# Patient Record
Sex: Male | Born: 1940
Health system: Southern US, Community
[De-identification: ages and names within clinical notes are randomized; demographics above are authoritative.]

## PROBLEM LIST (undated history)

## (undated) DIAGNOSIS — I639 Cerebral infarction, unspecified: Secondary | ICD-10-CM

## (undated) DIAGNOSIS — Z720 Tobacco use: Secondary | ICD-10-CM

## (undated) DIAGNOSIS — E785 Hyperlipidemia, unspecified: Secondary | ICD-10-CM

## (undated) DIAGNOSIS — I2510 Atherosclerotic heart disease of native coronary artery without angina pectoris: Secondary | ICD-10-CM

## (undated) DIAGNOSIS — I10 Essential (primary) hypertension: Secondary | ICD-10-CM

## (undated) HISTORY — DX: Hyperlipidemia, unspecified: E78.5

## (undated) HISTORY — DX: Atherosclerotic heart disease of native coronary artery without angina pectoris: I25.10

## (undated) HISTORY — DX: Tobacco use: Z72.0

## (undated) HISTORY — DX: Essential (primary) hypertension: I10

## (undated) HISTORY — DX: Cerebral infarction, unspecified: I63.9

---

## 2004-12-12 ENCOUNTER — Ambulatory Visit: Payer: Self-pay | Admitting: Cardiology

## 2004-12-12 ENCOUNTER — Ambulatory Visit (HOSPITAL_COMMUNITY): Admission: RE | Admit: 2004-12-12 | Discharge: 2004-12-12 | Payer: Self-pay | Admitting: Cardiology

## 2015-02-12 ENCOUNTER — Telehealth: Payer: Self-pay | Admitting: Cardiovascular Disease

## 2015-02-12 NOTE — Telephone Encounter (Signed)
Received records from [facility] for appointment on 02/20/15 with Dr Gwenlyn Found.  Records given to D Ricci Barker (medical records) for Dr Kennon Holter schedule on 02/20/15.  lp

## 2015-02-14 DIAGNOSIS — F0781 Postconcussional syndrome: Secondary | ICD-10-CM | POA: Insufficient documentation

## 2015-02-20 ENCOUNTER — Ambulatory Visit (INDEPENDENT_AMBULATORY_CARE_PROVIDER_SITE_OTHER): Payer: Medicare HMO | Admitting: Cardiovascular Disease

## 2015-02-20 ENCOUNTER — Encounter: Payer: Self-pay | Admitting: Cardiovascular Disease

## 2015-02-20 VITALS — BP 126/68 | HR 65 | Ht 64.0 in | Wt 169.8 lb

## 2015-02-20 DIAGNOSIS — Z720 Tobacco use: Secondary | ICD-10-CM | POA: Diagnosis not present

## 2015-02-20 DIAGNOSIS — I10 Essential (primary) hypertension: Secondary | ICD-10-CM | POA: Diagnosis not present

## 2015-02-20 DIAGNOSIS — R079 Chest pain, unspecified: Secondary | ICD-10-CM

## 2015-02-20 DIAGNOSIS — I2510 Atherosclerotic heart disease of native coronary artery without angina pectoris: Secondary | ICD-10-CM

## 2015-02-20 DIAGNOSIS — I2583 Coronary atherosclerosis due to lipid rich plaque: Secondary | ICD-10-CM

## 2015-02-20 DIAGNOSIS — E785 Hyperlipidemia, unspecified: Secondary | ICD-10-CM

## 2015-02-20 NOTE — Assessment & Plan Note (Signed)
History of CAD status post Xience drug-eluting stent implantation in the mid RCA at [facility] 12/09/12. This was done because of chest pain. He continues to have chest pain at night which occasionally awakens her from sleep and was nitrate responsive. I am going to get a exercise Myoview stress test to rule out an ischemic etiology.

## 2015-02-20 NOTE — Patient Instructions (Signed)
  We will see you back in follow up in 1 year with Dr Gwenlyn Found.   Dr Gwenlyn Found has ordered: Exercise Myoview- this is a test that looks at the blood flow to your heart muscle.  It takes approximately 2 1/2 hours. Please follow instruction sheet, as given.

## 2015-02-20 NOTE — Assessment & Plan Note (Signed)
History of hypertension blood pressure measured at 126/68. He is on valsartan. Continued current meds at current dosing

## 2015-02-20 NOTE — Assessment & Plan Note (Signed)
History of hyperlipidemia on atorvastatin followed by his PCP 

## 2015-02-20 NOTE — Progress Notes (Signed)
02/20/2015 Justin Conley   1940/09/19  [id number]  Primary Physician No primary care provider on file. Primary Cardiologist: Lorretta Harp MD Renae Gloss   HPI:  Mr. Owczarzak is a 74 year old mildly overweight married Caucasian male father of 4 children, grandfather of a gradual gentleman who is accompanied by his wife Justin Conley today. 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 He was referred by Dr. Ilda Basset in [place] to be established in our practice. His risk factors include treated hypertension, hyperlipidemia and 50-pack-years of tobacco abuse continuing to smoke one pack per day. He does have a family history of heart disease in the father who died of a myocardial infarction and 3 siblings who had CAD as well. He had a stroke 10 years ago that has not left him with any deficits. He had a drug-eluting stent placed in his mid RCA 12/09/12 at [facility]. Does occasionally get nocturnal chest pain which is nitrate responsive.   Current Outpatient Prescriptions  Medication Sig Dispense Refill  . aspirin EC 81 MG tablet Take 81 mg by mouth.    Marland Kitchen atorvastatin (LIPITOR) 40 MG tablet Take 40 mg by mouth daily.    . clopidogrel (PLAVIX) 75 MG tablet Take 75 mg by mouth.    . valsartan (DIOVAN) 320 MG tablet Take 320 mg by mouth.     No current facility-administered medications for this visit.    Not on File  History   Social History  . Marital Status: Married    Spouse Name: N/A  . Number of Children: N/A  . Years of Education: N/A   Occupational History  . Not on file.   Social History Main Topics  . Smoking status: Current Every Day Smoker  . Smokeless tobacco: Not on file  . Alcohol Use: Not on file  . Drug Use: Not on file  . Sexual Activity: Not on file   Other Topics Concern  . Not on file   Social History Narrative  . No narrative on file     Review of Systems: General:  negative for chills, fever, night sweats or weight changes.  Cardiovascular: negative for chest pain, dyspnea on exertion, edema, orthopnea, palpitations, paroxysmal nocturnal dyspnea or shortness of breath Dermatological: negative for rash Respiratory: negative for cough or wheezing Urologic: negative for hematuria Abdominal: negative for nausea, vomiting, diarrhea, bright red blood per rectum, melena, or hematemesis Neurologic: negative for visual changes, syncope, or dizziness All other systems reviewed and are otherwise negative except as noted above.    Blood pressure 126/68, pulse 65, height 5\' 4"  (1.626 m), weight 169 lb 12.8 oz (77.021 kg).  General appearance: alert and no distress Neck: no adenopathy, no carotid bruit, no JVD, supple, symmetrical, trachea midline and thyroid not enlarged, symmetric, no tenderness/mass/nodules Lungs: clear to auscultation bilaterally Heart: regular rate and rhythm, S1, S2 normal, no murmur, click, rub or gallop Extremities: extremities normal, atraumatic, no cyanosis or edema  EKG normal sinus rhythm at 65 without ST or T-wave changes. I personally reviewed this EKG  ASSESSMENT AND PLAN:   Essential hypertension History of hypertension blood pressure measured at 126/68. He is on valsartan. Continued current meds at current dosing  Hyperlipidemia History of hyperlipidemia on atorvastatin followed by his PCP  Tobacco abuse History of tobacco abuse currently smoking one pack a day with 50-pack-year history recalcitrant to risk factor modification  Coronary artery disease History of  CAD status post Xience drug-eluting stent implantation in the mid RCA at [facility] 12/09/12. This was done because of chest pain. He continues to have chest pain at night which occasionally awakens her from sleep and was nitrate responsive. I am going to get a exercise Myoview stress test to rule out an ischemic etiology.      Lorretta Harp  MD FACP,FACC,FAHA, [facility] 02/20/2015 8:49 AM

## 2015-02-20 NOTE — Assessment & Plan Note (Signed)
History of tobacco abuse currently smoking one pack a day with 50-pack-year history recalcitrant to risk factor modification

## 2015-02-21 ENCOUNTER — Encounter: Payer: Self-pay | Admitting: Cardiovascular Disease

## 2015-03-01 ENCOUNTER — Telehealth (HOSPITAL_COMMUNITY): Payer: Self-pay

## 2015-03-01 NOTE — Telephone Encounter (Signed)
Encounter complete. 

## 2015-03-05 ENCOUNTER — Encounter: Payer: Self-pay | Admitting: Cardiovascular Disease

## 2015-03-06 ENCOUNTER — Encounter (HOSPITAL_COMMUNITY): Payer: Medicare HMO

## 2015-03-06 ENCOUNTER — Ambulatory Visit (HOSPITAL_COMMUNITY)
Admission: RE | Admit: 2015-03-06 | Discharge: 2015-03-06 | Disposition: A | Discharge status: Home / Self Care | Payer: Medicare HMO | Source: Ambulatory Visit | Attending: Cardiovascular Disease | Admitting: Cardiovascular Disease

## 2015-03-06 DIAGNOSIS — R079 Chest pain, unspecified: Secondary | ICD-10-CM | POA: Insufficient documentation

## 2015-03-06 LAB — MYOCARDIAL PERFUSION IMAGING
CHL CUP MPHR: 149 {beats}/min
CSEPED: 7 min
CSEPEDS: 32 s
Estimated workload: 7.9 METS
LV dias vol: 73 mL
LV sys vol: 29 mL
Peak HR: 134 {beats}/min
Percent HR: 89 %
RPE: 14
Rest HR: 64 {beats}/min
SDS: 0
SRS: 1
SSS: 1
TID: 1.19

## 2015-03-06 MED ORDER — TECHNETIUM TC 99M SESTAMIBI GENERIC - CARDIOLITE
10.2000 | Freq: Once | INTRAVENOUS | Status: AC | PRN
  Administered 2015-03-06: 10 via INTRAVENOUS

## 2015-03-06 MED ORDER — TECHNETIUM TC 99M SESTAMIBI GENERIC - CARDIOLITE
31.3000 | Freq: Once | INTRAVENOUS | Status: AC | PRN
  Administered 2015-03-06: 31.3 via INTRAVENOUS

## 2015-05-10 DIAGNOSIS — Z23 Encounter for immunization: Secondary | ICD-10-CM | POA: Diagnosis not present

## 2015-05-22 DIAGNOSIS — Z9181 History of falling: Secondary | ICD-10-CM | POA: Diagnosis not present

## 2015-05-22 DIAGNOSIS — I2510 Atherosclerotic heart disease of native coronary artery without angina pectoris: Secondary | ICD-10-CM | POA: Diagnosis not present

## 2015-05-22 DIAGNOSIS — D519 Vitamin B12 deficiency anemia, unspecified: Secondary | ICD-10-CM | POA: Diagnosis not present

## 2015-05-22 DIAGNOSIS — R69 Illness, unspecified: Secondary | ICD-10-CM | POA: Diagnosis not present

## 2015-05-22 DIAGNOSIS — I10 Essential (primary) hypertension: Secondary | ICD-10-CM | POA: Diagnosis not present

## 2015-05-22 DIAGNOSIS — E785 Hyperlipidemia, unspecified: Secondary | ICD-10-CM | POA: Diagnosis not present

## 2015-05-22 DIAGNOSIS — Z6828 Body mass index (BMI) 28.0-28.9, adult: Secondary | ICD-10-CM | POA: Diagnosis not present

## 2015-05-23 DIAGNOSIS — E785 Hyperlipidemia, unspecified: Secondary | ICD-10-CM | POA: Diagnosis not present

## 2015-05-23 DIAGNOSIS — D519 Vitamin B12 deficiency anemia, unspecified: Secondary | ICD-10-CM | POA: Diagnosis not present

## 2015-06-06 ENCOUNTER — Encounter: Payer: Self-pay | Admitting: Cardiovascular Disease

## 2015-07-24 DIAGNOSIS — L570 Actinic keratosis: Secondary | ICD-10-CM | POA: Diagnosis not present

## 2015-07-24 DIAGNOSIS — B353 Tinea pedis: Secondary | ICD-10-CM | POA: Diagnosis not present

## 2015-09-10 DIAGNOSIS — E7800 Pure hypercholesterolemia, unspecified: Secondary | ICD-10-CM | POA: Diagnosis not present

## 2015-09-10 DIAGNOSIS — I252 Old myocardial infarction: Secondary | ICD-10-CM | POA: Diagnosis not present

## 2015-09-10 DIAGNOSIS — I10 Essential (primary) hypertension: Secondary | ICD-10-CM | POA: Diagnosis not present

## 2015-11-05 DIAGNOSIS — J449 Chronic obstructive pulmonary disease, unspecified: Secondary | ICD-10-CM | POA: Diagnosis not present

## 2015-11-05 DIAGNOSIS — Z6828 Body mass index (BMI) 28.0-28.9, adult: Secondary | ICD-10-CM | POA: Diagnosis not present

## 2015-11-05 DIAGNOSIS — D1720 Benign lipomatous neoplasm of skin and subcutaneous tissue of unspecified limb: Secondary | ICD-10-CM | POA: Diagnosis not present

## 2015-11-06 DIAGNOSIS — R69 Illness, unspecified: Secondary | ICD-10-CM | POA: Diagnosis not present

## 2015-11-06 DIAGNOSIS — Z950 Presence of cardiac pacemaker: Secondary | ICD-10-CM | POA: Diagnosis not present

## 2015-11-06 DIAGNOSIS — K649 Unspecified hemorrhoids: Secondary | ICD-10-CM | POA: Diagnosis not present

## 2015-11-07 DIAGNOSIS — R69 Illness, unspecified: Secondary | ICD-10-CM | POA: Diagnosis not present

## 2015-11-07 DIAGNOSIS — Z6827 Body mass index (BMI) 27.0-27.9, adult: Secondary | ICD-10-CM | POA: Diagnosis not present

## 2015-11-07 DIAGNOSIS — J441 Chronic obstructive pulmonary disease with (acute) exacerbation: Secondary | ICD-10-CM | POA: Diagnosis not present

## 2016-01-01 DIAGNOSIS — R413 Other amnesia: Secondary | ICD-10-CM | POA: Diagnosis not present

## 2016-01-01 DIAGNOSIS — I10 Essential (primary) hypertension: Secondary | ICD-10-CM | POA: Diagnosis not present

## 2016-01-01 DIAGNOSIS — D519 Vitamin B12 deficiency anemia, unspecified: Secondary | ICD-10-CM | POA: Diagnosis not present

## 2016-01-01 DIAGNOSIS — Z8673 Personal history of transient ischemic attack (TIA), and cerebral infarction without residual deficits: Secondary | ICD-10-CM | POA: Diagnosis not present

## 2016-01-01 DIAGNOSIS — Z125 Encounter for screening for malignant neoplasm of prostate: Secondary | ICD-10-CM | POA: Diagnosis not present

## 2016-01-01 DIAGNOSIS — R69 Illness, unspecified: Secondary | ICD-10-CM | POA: Diagnosis not present

## 2016-01-01 DIAGNOSIS — I2510 Atherosclerotic heart disease of native coronary artery without angina pectoris: Secondary | ICD-10-CM | POA: Diagnosis not present

## 2016-01-01 DIAGNOSIS — Z6828 Body mass index (BMI) 28.0-28.9, adult: Secondary | ICD-10-CM | POA: Diagnosis not present

## 2016-01-01 DIAGNOSIS — E785 Hyperlipidemia, unspecified: Secondary | ICD-10-CM | POA: Diagnosis not present

## 2016-01-07 DIAGNOSIS — H40003 Preglaucoma, unspecified, bilateral: Secondary | ICD-10-CM | POA: Diagnosis not present

## 2016-01-28 DIAGNOSIS — I6523 Occlusion and stenosis of bilateral carotid arteries: Secondary | ICD-10-CM | POA: Diagnosis not present

## 2016-01-28 DIAGNOSIS — Z8673 Personal history of transient ischemic attack (TIA), and cerebral infarction without residual deficits: Secondary | ICD-10-CM | POA: Diagnosis not present

## 2016-01-28 DIAGNOSIS — I6521 Occlusion and stenosis of right carotid artery: Secondary | ICD-10-CM | POA: Diagnosis not present

## 2016-03-14 DIAGNOSIS — H1033 Unspecified acute conjunctivitis, bilateral: Secondary | ICD-10-CM | POA: Diagnosis not present

## 2016-04-22 DIAGNOSIS — Z1389 Encounter for screening for other disorder: Secondary | ICD-10-CM | POA: Diagnosis not present

## 2016-04-22 DIAGNOSIS — E785 Hyperlipidemia, unspecified: Secondary | ICD-10-CM | POA: Diagnosis not present

## 2016-04-22 DIAGNOSIS — Z6828 Body mass index (BMI) 28.0-28.9, adult: Secondary | ICD-10-CM | POA: Diagnosis not present

## 2016-04-22 DIAGNOSIS — I2510 Atherosclerotic heart disease of native coronary artery without angina pectoris: Secondary | ICD-10-CM | POA: Diagnosis not present

## 2016-04-22 DIAGNOSIS — I10 Essential (primary) hypertension: Secondary | ICD-10-CM | POA: Diagnosis not present

## 2016-04-22 DIAGNOSIS — G309 Alzheimer's disease, unspecified: Secondary | ICD-10-CM | POA: Diagnosis not present

## 2016-04-22 DIAGNOSIS — D519 Vitamin B12 deficiency anemia, unspecified: Secondary | ICD-10-CM | POA: Diagnosis not present

## 2016-04-22 DIAGNOSIS — R69 Illness, unspecified: Secondary | ICD-10-CM | POA: Diagnosis not present

## 2016-07-06 DIAGNOSIS — L0390 Cellulitis, unspecified: Secondary | ICD-10-CM | POA: Diagnosis not present

## 2016-07-06 DIAGNOSIS — M86242 Subacute osteomyelitis, left hand: Secondary | ICD-10-CM | POA: Diagnosis not present

## 2016-07-06 IMAGING — NM NM MISC PROCEDURE
6 series · 36 of 36 positions shown · non-contrast
Comparison: none

[Series 1: wbr_r-proj_st wbr rest · 6.40mm/px · 6 of 64 frames shown]
[frame 6/64]
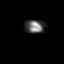
[frame 16/64]
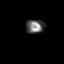
[frame 27/64]
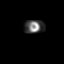
[frame 38/64]
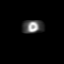
[frame 48/64]
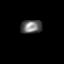
[frame 59/64]
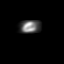

[Series 1: wbr rest · 6.40mm/px · 6 of 64 frames shown]
[frame 6/64]
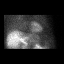
[frame 16/64]
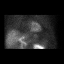
[frame 27/64]
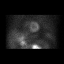
[frame 38/64]
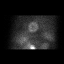
[frame 48/64]
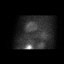
[frame 59/64]
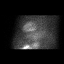

[Series 2: wbr stress-gsp · 6.40mm/px · 6 of 512 frames shown]
[frame 43/512  full-range]
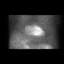
[frame 128/512  full-range]
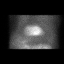
[frame 214/512  full-range]
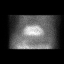
[frame 299/512  full-range]
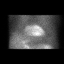
[frame 384/512  full-range]
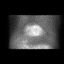
[frame 470/512  full-range]
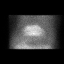

[Series 2: wbr_s-proj_st wbr stress-gsp · 6.40mm/px · 6 of 512 frames shown]
[frame 43/512]
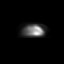
[frame 128/512]
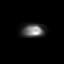
[frame 214/512]
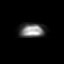
[frame 299/512]
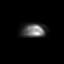
[frame 384/512]
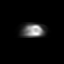
[frame 470/512]
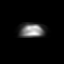

[Series 3: wbr stress-sum-em · 6.40mm/px · 6 of 64 frames shown]
[frame 6/64]
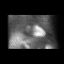
[frame 16/64]
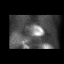
[frame 27/64]
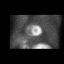
[frame 38/64]
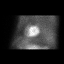
[frame 48/64]
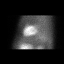
[frame 59/64]
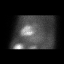

[Series 3: wbr_s-proj_st wbr stress-sum-em · 6.40mm/px · 6 of 64 frames shown]
[frame 6/64]
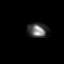
[frame 16/64]
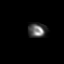
[frame 27/64]
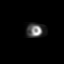
[frame 38/64]
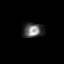
[frame 48/64]
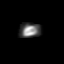
[frame 59/64]
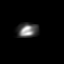

[36 of 36 positions shown; findings below may reference images not displayed]

Canned report from images found in remote index.

Refer to host system for actual result text.

## 2016-08-05 DIAGNOSIS — R69 Illness, unspecified: Secondary | ICD-10-CM | POA: Diagnosis not present

## 2016-08-28 DIAGNOSIS — R69 Illness, unspecified: Secondary | ICD-10-CM | POA: Diagnosis not present

## 2016-09-05 DIAGNOSIS — T8484XA Pain due to internal orthopedic prosthetic devices, implants and grafts, initial encounter: Secondary | ICD-10-CM | POA: Diagnosis not present

## 2016-09-10 DIAGNOSIS — M25561 Pain in right knee: Secondary | ICD-10-CM | POA: Diagnosis not present

## 2016-09-10 DIAGNOSIS — T8484XA Pain due to internal orthopedic prosthetic devices, implants and grafts, initial encounter: Secondary | ICD-10-CM | POA: Diagnosis not present

## 2016-09-10 DIAGNOSIS — X58XXXA Exposure to other specified factors, initial encounter: Secondary | ICD-10-CM | POA: Diagnosis not present

## 2016-09-12 DIAGNOSIS — I2510 Atherosclerotic heart disease of native coronary artery without angina pectoris: Secondary | ICD-10-CM | POA: Diagnosis not present

## 2016-09-12 DIAGNOSIS — I10 Essential (primary) hypertension: Secondary | ICD-10-CM | POA: Diagnosis not present

## 2016-09-12 DIAGNOSIS — D519 Vitamin B12 deficiency anemia, unspecified: Secondary | ICD-10-CM | POA: Diagnosis not present

## 2016-09-12 DIAGNOSIS — R69 Illness, unspecified: Secondary | ICD-10-CM | POA: Diagnosis not present

## 2016-09-12 DIAGNOSIS — K439 Ventral hernia without obstruction or gangrene: Secondary | ICD-10-CM | POA: Diagnosis not present

## 2016-09-12 DIAGNOSIS — E785 Hyperlipidemia, unspecified: Secondary | ICD-10-CM | POA: Diagnosis not present

## 2016-09-18 DIAGNOSIS — T8484XA Pain due to internal orthopedic prosthetic devices, implants and grafts, initial encounter: Secondary | ICD-10-CM | POA: Diagnosis not present

## 2016-09-18 DIAGNOSIS — T84038A Mechanical loosening of other internal prosthetic joint, initial encounter: Secondary | ICD-10-CM | POA: Diagnosis not present

## 2016-12-03 DIAGNOSIS — Z125 Encounter for screening for malignant neoplasm of prostate: Secondary | ICD-10-CM | POA: Diagnosis not present

## 2016-12-03 DIAGNOSIS — Z1389 Encounter for screening for other disorder: Secondary | ICD-10-CM | POA: Diagnosis not present

## 2016-12-03 DIAGNOSIS — E785 Hyperlipidemia, unspecified: Secondary | ICD-10-CM | POA: Diagnosis not present

## 2016-12-03 DIAGNOSIS — Z9181 History of falling: Secondary | ICD-10-CM | POA: Diagnosis not present

## 2016-12-03 DIAGNOSIS — Z0000 Encounter for general adult medical examination without abnormal findings: Secondary | ICD-10-CM | POA: Diagnosis not present

## 2016-12-03 DIAGNOSIS — R7301 Impaired fasting glucose: Secondary | ICD-10-CM | POA: Diagnosis not present

## 2016-12-09 DIAGNOSIS — R1013 Epigastric pain: Secondary | ICD-10-CM | POA: Diagnosis not present

## 2016-12-09 DIAGNOSIS — K432 Incisional hernia without obstruction or gangrene: Secondary | ICD-10-CM | POA: Insufficient documentation

## 2016-12-10 DIAGNOSIS — R7301 Impaired fasting glucose: Secondary | ICD-10-CM | POA: Diagnosis not present

## 2016-12-16 ENCOUNTER — Encounter: Payer: Self-pay | Admitting: Cardiovascular Disease

## 2016-12-16 ENCOUNTER — Ambulatory Visit (INDEPENDENT_AMBULATORY_CARE_PROVIDER_SITE_OTHER): Payer: Medicare HMO | Admitting: Cardiovascular Disease

## 2016-12-16 VITALS — BP 122/54 | HR 64 | Ht 66.5 in | Wt 181.6 lb

## 2016-12-16 DIAGNOSIS — Z125 Encounter for screening for malignant neoplasm of prostate: Secondary | ICD-10-CM | POA: Diagnosis not present

## 2016-12-16 DIAGNOSIS — I10 Essential (primary) hypertension: Secondary | ICD-10-CM | POA: Diagnosis not present

## 2016-12-16 DIAGNOSIS — Z720 Tobacco use: Secondary | ICD-10-CM | POA: Diagnosis not present

## 2016-12-16 DIAGNOSIS — D519 Vitamin B12 deficiency anemia, unspecified: Secondary | ICD-10-CM | POA: Diagnosis not present

## 2016-12-16 DIAGNOSIS — I6523 Occlusion and stenosis of bilateral carotid arteries: Secondary | ICD-10-CM | POA: Diagnosis not present

## 2016-12-16 DIAGNOSIS — F17200 Nicotine dependence, unspecified, uncomplicated: Secondary | ICD-10-CM | POA: Diagnosis not present

## 2016-12-16 DIAGNOSIS — E7800 Pure hypercholesterolemia, unspecified: Secondary | ICD-10-CM

## 2016-12-16 DIAGNOSIS — E785 Hyperlipidemia, unspecified: Secondary | ICD-10-CM | POA: Diagnosis not present

## 2016-12-16 DIAGNOSIS — I2510 Atherosclerotic heart disease of native coronary artery without angina pectoris: Secondary | ICD-10-CM | POA: Diagnosis not present

## 2016-12-16 DIAGNOSIS — H6121 Impacted cerumen, right ear: Secondary | ICD-10-CM | POA: Diagnosis not present

## 2016-12-16 DIAGNOSIS — Z6828 Body mass index (BMI) 28.0-28.9, adult: Secondary | ICD-10-CM | POA: Diagnosis not present

## 2016-12-16 DIAGNOSIS — R69 Illness, unspecified: Secondary | ICD-10-CM | POA: Diagnosis not present

## 2016-12-16 NOTE — Assessment & Plan Note (Signed)
History of continued tobacco abuse of one pack per day recalcitrant to risk factor modification.. 

## 2016-12-16 NOTE — Progress Notes (Signed)
12/16/2016 Justin Conley   21-Aug-1940  [id number]  Primary Physician Justin Dress, MD Primary Cardiologist: Justin Harp MD Justin Conley  HPI:  Justin Conley is a 76 year old mildly overweight married Caucasian male father of 4 children, grandfather of 8 grandchildren who is accompanied by his wife Justin Conley today. 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 Ilda Conley in [place] to be established in our practice. I last saw him in the office 02/20/15 His risk factors include treated hypertension, hyperlipidemia and 50-pack-years of tobacco abuse continuing to smoke one pack per day. He does have a family history of heart disease in the father who died of a myocardial infarction and 3 siblings who had CAD as well. He had a stroke 10 years ago that has not left him with any deficits. He had a drug-eluting stent placed in his mid RCA 12/09/12 at [facility]. Does occasionally get nocturnal chest pain which is nitrate responsive. He had a Myoview stress test performed in our office 03/06/15 which was low risk. I saw him 2 years ago he denies chest pain or shortness of breath. He apparently needs a small ventral hernia repaired in the near future which he will be cleared for.  Current Outpatient Prescriptions  Medication Sig Dispense Refill  . aspirin EC 81 MG tablet Take 81 mg by mouth.    Marland Kitchen atorvastatin (LIPITOR) 40 MG tablet Take 40 mg by mouth daily.    . citalopram (CELEXA) 20 MG tablet Take 20 mg by mouth daily.  2  . clopidogrel (PLAVIX) 75 MG tablet Take 1 tablet by mouth daily.    . cyanocobalamin (,VITAMIN B-12,) 1000 MCG/ML injection Inject 1 mL into the muscle every 30 (thirty) days.    Marland Kitchen donepezil (ARICEPT) 5 MG tablet Take 1 tablet by mouth daily.    Marland Kitchen PROAIR HFA 108 (90 Base) MCG/ACT inhaler Inhale 2 puffs into the lungs as directed.  7  . valsartan (DIOVAN) 320 MG tablet  Take 320 mg by mouth.    . valsartan (DIOVAN) 320 MG tablet Take 1 tablet by mouth daily.    . valsartan-hydrochlorothiazide (DIOVAN-HCT) 320-25 MG tablet Take 1 tablet by mouth daily.     No current facility-administered medications for this visit.     Allergies  Allergen Reactions  . Ace Inhibitors Cough    Social History   Social History  . Marital status: Married    Spouse name: N/A  . Number of children: N/A  . Years of education: N/A   Occupational History  . Not on file.   Social History Main Topics  . Smoking status: Current Every Day Smoker  . Smokeless tobacco: Current User    Types: Chew  . Alcohol use Not on file  . Drug use: Unknown  . Sexual activity: Not on file   Other Topics Concern  . Not on file   Social History Narrative  . No narrative on file     Review of Systems: General: negative for chills, fever, night sweats or weight changes.  Cardiovascular: negative for chest pain, dyspnea on exertion, edema, orthopnea, palpitations, paroxysmal nocturnal dyspnea or shortness of breath Dermatological: negative for rash Respiratory: negative for cough or wheezing Urologic: negative for hematuria Abdominal: negative for nausea, vomiting, diarrhea, bright red blood per rectum, melena, or hematemesis Neurologic: negative for visual changes, syncope, or dizziness All other  systems reviewed and are otherwise negative except as noted above.    Blood pressure (!) 122/54, pulse 64, height 5' 6.5" (1.689 m), weight 181 lb 9.6 oz (82.4 kg).  General appearance: alert and no distress Neck: no adenopathy, no carotid bruit, no JVD, supple, symmetrical, trachea midline and thyroid not enlarged, symmetric, no tenderness/mass/nodules Lungs: clear to auscultation bilaterally Heart: regular rate and rhythm, S1, S2 normal, no murmur, click, rub or gallop Extremities: extremities normal, atraumatic, no cyanosis or edema  EKG sinus rhythm at 64 without ST or T-wave  changes. I personally reviewed this EKG  ASSESSMENT AND PLAN:   Essential hypertension History of hypertension blood pressure measured at 126/68. He is on valsartan. Continue current meds at current dosing  Hyperlipidemia History of hyperlipidemia on statin therapy followed by his PCP  Tobacco abuse History of continued tobacco abuse of one pack per day recalcitrant to risk factor modification  Coronary artery disease History of coronary artery disease status post mid RCA PCI and drug-eluting stenting 12/09/12 at [facility]. He did have a Myoview stress test performed because of occasional chest pain 03/06/15 which was entirely normal. Since I saw him 2 years ago and he has remained stable denying chest pain. He is no longer on dual antiplatelet therapy .      Justin Harp MD FACP,FACC,FAHA, [facility] 12/16/2016 2:17 PM

## 2016-12-16 NOTE — Assessment & Plan Note (Signed)
History of hypertension blood pressure measured at 126/68. He is on valsartan. Continue current meds at current dosing

## 2016-12-16 NOTE — Patient Instructions (Signed)
Medication Instructions: Your physician recommends that you continue on your current medications as directed. Please refer to the Current Medication list given to you today.   Follow-Up: Your physician wants you to follow-up in: 1 year with Dr. Gwenlyn Found. You will receive a reminder letter in the mail two months in advance. If you don't receive a letter, please call our office to schedule the follow-up appointment.  If you need a refill on your cardiac medications before your next appointment, please call your pharmacy.  You have been cleared at low risk for your procedure.

## 2016-12-16 NOTE — Assessment & Plan Note (Signed)
History of coronary artery disease status post mid RCA PCI and drug-eluting stenting 12/09/12 at [facility]. He did have a Myoview stress test performed because of occasional chest pain 03/06/15 which was entirely normal. Since I saw him 2 years ago and he has remained stable denying chest pain. He is no longer on dual antiplatelet therapy .

## 2016-12-16 NOTE — Assessment & Plan Note (Signed)
History of hyperlipidemia on statin therapy followed by his PCP 

## 2016-12-29 DIAGNOSIS — I6523 Occlusion and stenosis of bilateral carotid arteries: Secondary | ICD-10-CM | POA: Diagnosis not present

## 2016-12-29 DIAGNOSIS — I10 Essential (primary) hypertension: Secondary | ICD-10-CM | POA: Diagnosis not present

## 2016-12-29 DIAGNOSIS — Z8673 Personal history of transient ischemic attack (TIA), and cerebral infarction without residual deficits: Secondary | ICD-10-CM | POA: Diagnosis not present

## 2016-12-29 DIAGNOSIS — E785 Hyperlipidemia, unspecified: Secondary | ICD-10-CM | POA: Diagnosis not present

## 2016-12-29 DIAGNOSIS — I2510 Atherosclerotic heart disease of native coronary artery without angina pectoris: Secondary | ICD-10-CM | POA: Diagnosis not present

## 2016-12-31 DIAGNOSIS — I129 Hypertensive chronic kidney disease with stage 1 through stage 4 chronic kidney disease, or unspecified chronic kidney disease: Secondary | ICD-10-CM | POA: Diagnosis not present

## 2016-12-31 DIAGNOSIS — I2510 Atherosclerotic heart disease of native coronary artery without angina pectoris: Secondary | ICD-10-CM | POA: Diagnosis not present

## 2016-12-31 DIAGNOSIS — K430 Incisional hernia with obstruction, without gangrene: Secondary | ICD-10-CM | POA: Diagnosis not present

## 2016-12-31 DIAGNOSIS — K432 Incisional hernia without obstruction or gangrene: Secondary | ICD-10-CM | POA: Diagnosis not present

## 2016-12-31 DIAGNOSIS — F17210 Nicotine dependence, cigarettes, uncomplicated: Secondary | ICD-10-CM | POA: Diagnosis not present

## 2016-12-31 DIAGNOSIS — Z01810 Encounter for preprocedural cardiovascular examination: Secondary | ICD-10-CM | POA: Diagnosis not present

## 2016-12-31 DIAGNOSIS — M1990 Unspecified osteoarthritis, unspecified site: Secondary | ICD-10-CM | POA: Diagnosis not present

## 2016-12-31 DIAGNOSIS — J449 Chronic obstructive pulmonary disease, unspecified: Secondary | ICD-10-CM | POA: Diagnosis not present

## 2016-12-31 DIAGNOSIS — I10 Essential (primary) hypertension: Secondary | ICD-10-CM | POA: Diagnosis not present

## 2016-12-31 DIAGNOSIS — N183 Chronic kidney disease, stage 3 (moderate): Secondary | ICD-10-CM | POA: Diagnosis not present

## 2016-12-31 DIAGNOSIS — R69 Illness, unspecified: Secondary | ICD-10-CM | POA: Diagnosis not present

## 2016-12-31 DIAGNOSIS — E785 Hyperlipidemia, unspecified: Secondary | ICD-10-CM | POA: Diagnosis not present

## 2017-01-03 DIAGNOSIS — L03311 Cellulitis of abdominal wall: Secondary | ICD-10-CM | POA: Diagnosis not present

## 2017-01-12 DIAGNOSIS — Z09 Encounter for follow-up examination after completed treatment for conditions other than malignant neoplasm: Secondary | ICD-10-CM | POA: Insufficient documentation

## 2017-01-19 DIAGNOSIS — S90862A Insect bite (nonvenomous), left foot, initial encounter: Secondary | ICD-10-CM | POA: Diagnosis not present

## 2017-01-19 DIAGNOSIS — Z6828 Body mass index (BMI) 28.0-28.9, adult: Secondary | ICD-10-CM | POA: Diagnosis not present

## 2017-02-02 DIAGNOSIS — S0502XA Injury of conjunctiva and corneal abrasion without foreign body, left eye, initial encounter: Secondary | ICD-10-CM | POA: Diagnosis not present

## 2017-02-06 DIAGNOSIS — H25813 Combined forms of age-related cataract, bilateral: Secondary | ICD-10-CM | POA: Diagnosis not present

## 2017-02-06 DIAGNOSIS — H40013 Open angle with borderline findings, low risk, bilateral: Secondary | ICD-10-CM | POA: Diagnosis not present

## 2017-02-06 DIAGNOSIS — H02125 Mechanical ectropion of left lower eyelid: Secondary | ICD-10-CM | POA: Diagnosis not present

## 2017-02-06 DIAGNOSIS — H02122 Mechanical ectropion of right lower eyelid: Secondary | ICD-10-CM | POA: Diagnosis not present

## 2017-02-13 DIAGNOSIS — H25813 Combined forms of age-related cataract, bilateral: Secondary | ICD-10-CM | POA: Diagnosis not present

## 2017-02-13 DIAGNOSIS — H02112 Cicatricial ectropion of right lower eyelid: Secondary | ICD-10-CM | POA: Diagnosis not present

## 2017-02-13 DIAGNOSIS — H11153 Pinguecula, bilateral: Secondary | ICD-10-CM | POA: Diagnosis not present

## 2017-02-13 DIAGNOSIS — H04123 Dry eye syndrome of bilateral lacrimal glands: Secondary | ICD-10-CM | POA: Diagnosis not present

## 2017-02-25 DIAGNOSIS — H25811 Combined forms of age-related cataract, right eye: Secondary | ICD-10-CM | POA: Diagnosis not present

## 2017-02-25 DIAGNOSIS — H2511 Age-related nuclear cataract, right eye: Secondary | ICD-10-CM | POA: Diagnosis not present

## 2017-03-12 DIAGNOSIS — Z961 Presence of intraocular lens: Secondary | ICD-10-CM | POA: Diagnosis not present

## 2017-03-23 DIAGNOSIS — I10 Essential (primary) hypertension: Secondary | ICD-10-CM | POA: Diagnosis not present

## 2017-03-23 DIAGNOSIS — D519 Vitamin B12 deficiency anemia, unspecified: Secondary | ICD-10-CM | POA: Diagnosis not present

## 2017-03-23 DIAGNOSIS — E785 Hyperlipidemia, unspecified: Secondary | ICD-10-CM | POA: Diagnosis not present

## 2017-03-23 DIAGNOSIS — I2510 Atherosclerotic heart disease of native coronary artery without angina pectoris: Secondary | ICD-10-CM | POA: Diagnosis not present

## 2017-03-23 DIAGNOSIS — R69 Illness, unspecified: Secondary | ICD-10-CM | POA: Diagnosis not present

## 2017-03-23 DIAGNOSIS — E663 Overweight: Secondary | ICD-10-CM | POA: Diagnosis not present

## 2017-03-24 DIAGNOSIS — I10 Essential (primary) hypertension: Secondary | ICD-10-CM | POA: Diagnosis not present

## 2017-03-24 DIAGNOSIS — M545 Low back pain: Secondary | ICD-10-CM | POA: Diagnosis not present

## 2017-03-24 DIAGNOSIS — E7800 Pure hypercholesterolemia, unspecified: Secondary | ICD-10-CM | POA: Diagnosis not present

## 2017-03-24 DIAGNOSIS — H9113 Presbycusis, bilateral: Secondary | ICD-10-CM | POA: Diagnosis not present

## 2017-03-24 DIAGNOSIS — I679 Cerebrovascular disease, unspecified: Secondary | ICD-10-CM | POA: Diagnosis not present

## 2017-03-24 DIAGNOSIS — R69 Illness, unspecified: Secondary | ICD-10-CM | POA: Diagnosis not present

## 2017-03-24 DIAGNOSIS — R233 Spontaneous ecchymoses: Secondary | ICD-10-CM | POA: Diagnosis not present

## 2017-03-24 DIAGNOSIS — E669 Obesity, unspecified: Secondary | ICD-10-CM | POA: Diagnosis not present

## 2017-03-24 DIAGNOSIS — J441 Chronic obstructive pulmonary disease with (acute) exacerbation: Secondary | ICD-10-CM | POA: Diagnosis not present

## 2017-03-24 DIAGNOSIS — Z0000 Encounter for general adult medical examination without abnormal findings: Secondary | ICD-10-CM | POA: Diagnosis not present

## 2017-03-25 DIAGNOSIS — H2512 Age-related nuclear cataract, left eye: Secondary | ICD-10-CM | POA: Diagnosis not present

## 2017-03-25 DIAGNOSIS — H25812 Combined forms of age-related cataract, left eye: Secondary | ICD-10-CM | POA: Diagnosis not present

## 2017-04-07 DIAGNOSIS — S90869A Insect bite (nonvenomous), unspecified foot, initial encounter: Secondary | ICD-10-CM | POA: Diagnosis not present

## 2017-04-07 DIAGNOSIS — Z6828 Body mass index (BMI) 28.0-28.9, adult: Secondary | ICD-10-CM | POA: Diagnosis not present

## 2017-04-16 DIAGNOSIS — Z961 Presence of intraocular lens: Secondary | ICD-10-CM | POA: Diagnosis not present

## 2017-04-27 DIAGNOSIS — I2510 Atherosclerotic heart disease of native coronary artery without angina pectoris: Secondary | ICD-10-CM | POA: Diagnosis not present

## 2017-04-27 DIAGNOSIS — E10319 Type 1 diabetes mellitus with unspecified diabetic retinopathy without macular edema: Secondary | ICD-10-CM | POA: Diagnosis not present

## 2017-04-27 DIAGNOSIS — R69 Illness, unspecified: Secondary | ICD-10-CM | POA: Diagnosis not present

## 2017-05-13 DIAGNOSIS — D485 Neoplasm of uncertain behavior of skin: Secondary | ICD-10-CM | POA: Diagnosis not present

## 2017-05-13 DIAGNOSIS — L578 Other skin changes due to chronic exposure to nonionizing radiation: Secondary | ICD-10-CM | POA: Diagnosis not present

## 2017-05-13 DIAGNOSIS — L821 Other seborrheic keratosis: Secondary | ICD-10-CM | POA: Diagnosis not present

## 2017-05-13 DIAGNOSIS — L570 Actinic keratosis: Secondary | ICD-10-CM | POA: Diagnosis not present

## 2017-06-04 DIAGNOSIS — M7711 Lateral epicondylitis, right elbow: Secondary | ICD-10-CM | POA: Diagnosis not present

## 2017-06-22 DIAGNOSIS — Z23 Encounter for immunization: Secondary | ICD-10-CM | POA: Diagnosis not present

## 2017-06-29 DIAGNOSIS — D519 Vitamin B12 deficiency anemia, unspecified: Secondary | ICD-10-CM | POA: Diagnosis not present

## 2017-06-29 DIAGNOSIS — J449 Chronic obstructive pulmonary disease, unspecified: Secondary | ICD-10-CM | POA: Diagnosis not present

## 2017-06-29 DIAGNOSIS — N401 Enlarged prostate with lower urinary tract symptoms: Secondary | ICD-10-CM | POA: Diagnosis not present

## 2017-06-29 DIAGNOSIS — E785 Hyperlipidemia, unspecified: Secondary | ICD-10-CM | POA: Diagnosis not present

## 2017-06-29 DIAGNOSIS — R69 Illness, unspecified: Secondary | ICD-10-CM | POA: Diagnosis not present

## 2017-06-29 DIAGNOSIS — Z125 Encounter for screening for malignant neoplasm of prostate: Secondary | ICD-10-CM | POA: Diagnosis not present

## 2017-06-29 DIAGNOSIS — I2510 Atherosclerotic heart disease of native coronary artery without angina pectoris: Secondary | ICD-10-CM | POA: Diagnosis not present

## 2017-06-29 DIAGNOSIS — I10 Essential (primary) hypertension: Secondary | ICD-10-CM | POA: Diagnosis not present

## 2017-07-16 DIAGNOSIS — M7711 Lateral epicondylitis, right elbow: Secondary | ICD-10-CM | POA: Diagnosis not present

## 2017-07-23 DIAGNOSIS — R091 Pleurisy: Secondary | ICD-10-CM | POA: Diagnosis not present

## 2017-07-23 DIAGNOSIS — R05 Cough: Secondary | ICD-10-CM | POA: Diagnosis not present

## 2017-07-23 DIAGNOSIS — J208 Acute bronchitis due to other specified organisms: Secondary | ICD-10-CM | POA: Diagnosis not present

## 2017-08-18 DIAGNOSIS — E10319 Type 1 diabetes mellitus with unspecified diabetic retinopathy without macular edema: Secondary | ICD-10-CM | POA: Diagnosis not present

## 2017-08-18 DIAGNOSIS — R69 Illness, unspecified: Secondary | ICD-10-CM | POA: Diagnosis not present

## 2017-08-18 DIAGNOSIS — I2510 Atherosclerotic heart disease of native coronary artery without angina pectoris: Secondary | ICD-10-CM | POA: Diagnosis not present

## 2017-10-01 DIAGNOSIS — R69 Illness, unspecified: Secondary | ICD-10-CM | POA: Diagnosis not present

## 2017-10-01 DIAGNOSIS — E785 Hyperlipidemia, unspecified: Secondary | ICD-10-CM | POA: Diagnosis not present

## 2017-10-01 DIAGNOSIS — Z6829 Body mass index (BMI) 29.0-29.9, adult: Secondary | ICD-10-CM | POA: Diagnosis not present

## 2017-10-01 DIAGNOSIS — D519 Vitamin B12 deficiency anemia, unspecified: Secondary | ICD-10-CM | POA: Diagnosis not present

## 2017-10-01 DIAGNOSIS — N401 Enlarged prostate with lower urinary tract symptoms: Secondary | ICD-10-CM | POA: Diagnosis not present

## 2017-10-01 DIAGNOSIS — I10 Essential (primary) hypertension: Secondary | ICD-10-CM | POA: Diagnosis not present

## 2017-10-01 DIAGNOSIS — I2510 Atherosclerotic heart disease of native coronary artery without angina pectoris: Secondary | ICD-10-CM | POA: Diagnosis not present

## 2017-11-24 DIAGNOSIS — Z1331 Encounter for screening for depression: Secondary | ICD-10-CM | POA: Diagnosis not present

## 2017-11-24 DIAGNOSIS — R635 Abnormal weight gain: Secondary | ICD-10-CM | POA: Diagnosis not present

## 2017-11-24 DIAGNOSIS — E663 Overweight: Secondary | ICD-10-CM | POA: Diagnosis not present

## 2017-11-24 DIAGNOSIS — Z6828 Body mass index (BMI) 28.0-28.9, adult: Secondary | ICD-10-CM | POA: Diagnosis not present

## 2017-11-26 DIAGNOSIS — R635 Abnormal weight gain: Secondary | ICD-10-CM | POA: Diagnosis not present

## 2017-11-26 DIAGNOSIS — Z6828 Body mass index (BMI) 28.0-28.9, adult: Secondary | ICD-10-CM | POA: Diagnosis not present

## 2017-12-04 DIAGNOSIS — Z0000 Encounter for general adult medical examination without abnormal findings: Secondary | ICD-10-CM | POA: Diagnosis not present

## 2017-12-04 DIAGNOSIS — E785 Hyperlipidemia, unspecified: Secondary | ICD-10-CM | POA: Diagnosis not present

## 2017-12-04 DIAGNOSIS — Z9181 History of falling: Secondary | ICD-10-CM | POA: Diagnosis not present

## 2017-12-04 DIAGNOSIS — Z125 Encounter for screening for malignant neoplasm of prostate: Secondary | ICD-10-CM | POA: Diagnosis not present

## 2017-12-04 DIAGNOSIS — Z1331 Encounter for screening for depression: Secondary | ICD-10-CM | POA: Diagnosis not present

## 2017-12-16 DIAGNOSIS — R69 Illness, unspecified: Secondary | ICD-10-CM | POA: Diagnosis not present

## 2017-12-21 DIAGNOSIS — Z961 Presence of intraocular lens: Secondary | ICD-10-CM | POA: Diagnosis not present

## 2017-12-23 DIAGNOSIS — Z6827 Body mass index (BMI) 27.0-27.9, adult: Secondary | ICD-10-CM | POA: Diagnosis not present

## 2017-12-23 DIAGNOSIS — R635 Abnormal weight gain: Secondary | ICD-10-CM | POA: Diagnosis not present

## 2018-01-11 DIAGNOSIS — M65312 Trigger thumb, left thumb: Secondary | ICD-10-CM | POA: Diagnosis not present

## 2018-01-12 DIAGNOSIS — N401 Enlarged prostate with lower urinary tract symptoms: Secondary | ICD-10-CM | POA: Diagnosis not present

## 2018-01-12 DIAGNOSIS — I2510 Atherosclerotic heart disease of native coronary artery without angina pectoris: Secondary | ICD-10-CM | POA: Diagnosis not present

## 2018-01-12 DIAGNOSIS — Z6826 Body mass index (BMI) 26.0-26.9, adult: Secondary | ICD-10-CM | POA: Diagnosis not present

## 2018-01-12 DIAGNOSIS — D519 Vitamin B12 deficiency anemia, unspecified: Secondary | ICD-10-CM | POA: Diagnosis not present

## 2018-01-12 DIAGNOSIS — R69 Illness, unspecified: Secondary | ICD-10-CM | POA: Diagnosis not present

## 2018-01-12 DIAGNOSIS — E785 Hyperlipidemia, unspecified: Secondary | ICD-10-CM | POA: Diagnosis not present

## 2018-01-12 DIAGNOSIS — I10 Essential (primary) hypertension: Secondary | ICD-10-CM | POA: Diagnosis not present

## 2018-02-01 DIAGNOSIS — S46912A Strain of unspecified muscle, fascia and tendon at shoulder and upper arm level, left arm, initial encounter: Secondary | ICD-10-CM | POA: Diagnosis not present

## 2018-02-01 DIAGNOSIS — Z6827 Body mass index (BMI) 27.0-27.9, adult: Secondary | ICD-10-CM | POA: Diagnosis not present

## 2018-03-09 ENCOUNTER — Ambulatory Visit: Payer: Medicare HMO | Admitting: Cardiovascular Disease

## 2018-03-09 ENCOUNTER — Encounter: Payer: Self-pay | Admitting: Cardiovascular Disease

## 2018-03-09 VITALS — BP 124/58 | HR 61 | Ht 66.5 in | Wt 170.0 lb

## 2018-03-09 DIAGNOSIS — E7800 Pure hypercholesterolemia, unspecified: Secondary | ICD-10-CM | POA: Diagnosis not present

## 2018-03-09 DIAGNOSIS — Z720 Tobacco use: Secondary | ICD-10-CM

## 2018-03-09 DIAGNOSIS — I10 Essential (primary) hypertension: Secondary | ICD-10-CM

## 2018-03-09 DIAGNOSIS — I2510 Atherosclerotic heart disease of native coronary artery without angina pectoris: Secondary | ICD-10-CM

## 2018-03-09 NOTE — Patient Instructions (Signed)

## 2018-03-09 NOTE — Assessment & Plan Note (Signed)
History of hyperlipidemia on statin therapy. 

## 2018-03-09 NOTE — Assessment & Plan Note (Signed)
History of CAD status post RCA D DES at [facility] 12/09/2012.  Myoview stress test performed 03/06/2015 was low risk.  He denies chest pain or shortness of breath.

## 2018-03-09 NOTE — Progress Notes (Signed)
03/09/2018 Justin Conley   Jun 25, 1941  [id number]  Primary Physician Nicoletta Dress, MD Primary Cardiologist: Lorretta Harp MD Lupe Carney, [place]  HPI:  BURYL BAMBER is a 77 y.o.  mildly overweight married Caucasian male father of 4 children, grandfather of 8 grandchildren who is accompanied by his wife Justin Conley today. 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 Ilda Basset in [place] to be established in our practice. I last saw him in the office 12/16/2016 His risk factors include treated hypertension, hyperlipidemia and 50-pack-years of tobacco abuse continuing to smoke one pack per day. He does have a family history of heart disease in the father who died of a myocardial infarction and 3 siblings who had CAD as well. He had a stroke 10 years ago that has not left him with any deficits. He had a drug-eluting stent placed in his mid RCA 12/09/12 at [facility]. Does occasionally get nocturnal chest pain which is nitrate responsive. He had a Myoview stress test performed in our office 03/06/15 which was low risk.  Since I saw him a year ago he is remained stable.  He denies chest pain or shortness of breath.  He was considering having his ventral hernia repaired but this never occurred.  Dr. Delena Bali follows his lipid profile.    Current Meds  Medication Sig  . aspirin EC 81 MG tablet Take 81 mg by mouth.  Marland Kitchen atorvastatin (LIPITOR) 40 MG tablet Take 40 mg by mouth daily.  . citalopram (CELEXA) 20 MG tablet Take 20 mg by mouth daily.  . cyanocobalamin (,VITAMIN B-12,) 1000 MCG/ML injection Inject 1 mL into the muscle every 30 (thirty) days.  Marland Kitchen donepezil (ARICEPT) 5 MG tablet Take 1 tablet by mouth daily.  Marland Kitchen PROAIR HFA 108 (90 Base) MCG/ACT inhaler Inhale 2 puffs into the lungs as directed.  . valsartan (DIOVAN) 320 MG tablet Take 320 mg by mouth.  . [DISCONTINUED] valsartan (DIOVAN) 320  MG tablet Take 1 tablet by mouth daily.  . [DISCONTINUED] valsartan-hydrochlorothiazide (DIOVAN-HCT) 320-25 MG tablet Take 1 tablet by mouth daily.     Allergies  Allergen Reactions  . Ace Inhibitors Cough    Social History   Socioeconomic History  . Marital status: Married    Spouse name: Not on file  . Number of children: Not on file  . Years of education: Not on file  . Highest education level: Not on file  Occupational History  . Not on file  Social Needs  . Financial resource strain: Not on file  . Food insecurity:    Worry: Not on file    Inability: Not on file  . Transportation needs:    Medical: Not on file    Non-medical: Not on file  Tobacco Use  . Smoking status: Current Every Day Smoker  . Smokeless tobacco: Current User    Types: Chew  Substance and Sexual Activity  . Alcohol use: Not on file  . Drug use: Not on file  . Sexual activity: Not on file  Lifestyle  . Physical activity:    Days per week: Not on file    Minutes per session: Not on file  . Stress: Not on file  Relationships  . Social connections:    Talks on phone: Not on file    Gets together: Not on file    Attends religious service: Not  on file    Active member of club or organization: Not on file    Attends meetings of clubs or organizations: Not on file    Relationship status: Not on file  . Intimate partner violence:    Fear of current or ex partner: Not on file    Emotionally abused: Not on file    Physically abused: Not on file    Forced sexual activity: Not on file  Other Topics Concern  . Not on file  Social History Narrative  . Not on file     Review of Systems: General: negative for chills, fever, night sweats or weight changes.  Cardiovascular: negative for chest pain, dyspnea on exertion, edema, orthopnea, palpitations, paroxysmal nocturnal dyspnea or shortness of breath Dermatological: negative for rash Respiratory: negative for cough or wheezing Urologic: negative  for hematuria Abdominal: negative for nausea, vomiting, diarrhea, bright red blood per rectum, melena, or hematemesis Neurologic: negative for visual changes, syncope, or dizziness All other systems reviewed and are otherwise negative except as noted above.    Blood pressure (!) 124/58, pulse 61, height 5' 6.5" (1.689 m), weight 170 lb (77.1 kg).  General appearance: alert and no distress Neck: no adenopathy, no carotid bruit, no JVD, supple, symmetrical, trachea midline and thyroid not enlarged, symmetric, no tenderness/mass/nodules Lungs: clear to auscultation bilaterally Heart: regular rate and rhythm, S1, S2 normal, no murmur, click, rub or gallop Extremities: extremities normal, atraumatic, no cyanosis or edema Pulses: 2+ and symmetric Skin: Skin color, texture, turgor normal. No rashes or lesions Neurologic: Alert and oriented X 3, normal strength and tone. Normal symmetric reflexes. Normal coordination and gait  EKG sinus rhythm at 61 without ST or T wave changes.  I personally reviewed this EKG.  ASSESSMENT AND PLAN:   Essential hypertension History of essential hypertension blood pressure measured at 124/58.  He is on valsartan.  Continue current meds at current dosing.  Hyperlipidemia History of hyperlipidemia on statin therapy.  Tobacco abuse History of ongoing tobacco abuse of 1 pack/day recalcitrant risk factor modification.  Coronary artery disease History of CAD status post RCA D DES at [facility] 12/09/2012.  Myoview stress test performed 03/06/2015 was low risk.  He denies chest pain or shortness of breath.      Lorretta Harp MD FACP,FACC,FAHA, [facility] 03/09/2018 9:58 AM

## 2018-03-09 NOTE — Assessment & Plan Note (Signed)
History of essential hypertension blood pressure measured at 124/58.  He is on valsartan.  Continue current meds at current dosing.

## 2018-03-09 NOTE — Assessment & Plan Note (Signed)
History of ongoing tobacco abuse of 1 pack/day recalcitrant risk factor modification. 

## 2018-03-12 DIAGNOSIS — Z7902 Long term (current) use of antithrombotics/antiplatelets: Secondary | ICD-10-CM | POA: Diagnosis not present

## 2018-03-12 DIAGNOSIS — E785 Hyperlipidemia, unspecified: Secondary | ICD-10-CM | POA: Diagnosis not present

## 2018-03-12 DIAGNOSIS — N529 Male erectile dysfunction, unspecified: Secondary | ICD-10-CM | POA: Diagnosis not present

## 2018-03-12 DIAGNOSIS — I2510 Atherosclerotic heart disease of native coronary artery without angina pectoris: Secondary | ICD-10-CM | POA: Diagnosis not present

## 2018-03-12 DIAGNOSIS — H04129 Dry eye syndrome of unspecified lacrimal gland: Secondary | ICD-10-CM | POA: Diagnosis not present

## 2018-03-12 DIAGNOSIS — I10 Essential (primary) hypertension: Secondary | ICD-10-CM | POA: Diagnosis not present

## 2018-03-12 DIAGNOSIS — J449 Chronic obstructive pulmonary disease, unspecified: Secondary | ICD-10-CM | POA: Diagnosis not present

## 2018-03-12 DIAGNOSIS — R69 Illness, unspecified: Secondary | ICD-10-CM | POA: Diagnosis not present

## 2018-03-31 DIAGNOSIS — K5730 Diverticulosis of large intestine without perforation or abscess without bleeding: Secondary | ICD-10-CM | POA: Diagnosis not present

## 2018-04-15 DIAGNOSIS — R69 Illness, unspecified: Secondary | ICD-10-CM | POA: Diagnosis not present

## 2018-04-15 DIAGNOSIS — Z125 Encounter for screening for malignant neoplasm of prostate: Secondary | ICD-10-CM | POA: Diagnosis not present

## 2018-04-15 DIAGNOSIS — Z6827 Body mass index (BMI) 27.0-27.9, adult: Secondary | ICD-10-CM | POA: Diagnosis not present

## 2018-04-15 DIAGNOSIS — I2510 Atherosclerotic heart disease of native coronary artery without angina pectoris: Secondary | ICD-10-CM | POA: Diagnosis not present

## 2018-04-15 DIAGNOSIS — E785 Hyperlipidemia, unspecified: Secondary | ICD-10-CM | POA: Diagnosis not present

## 2018-04-15 DIAGNOSIS — I10 Essential (primary) hypertension: Secondary | ICD-10-CM | POA: Diagnosis not present

## 2018-04-15 DIAGNOSIS — N401 Enlarged prostate with lower urinary tract symptoms: Secondary | ICD-10-CM | POA: Diagnosis not present

## 2018-04-15 DIAGNOSIS — D519 Vitamin B12 deficiency anemia, unspecified: Secondary | ICD-10-CM | POA: Diagnosis not present

## 2018-04-26 DIAGNOSIS — R69 Illness, unspecified: Secondary | ICD-10-CM | POA: Diagnosis not present

## 2018-05-04 DIAGNOSIS — R69 Illness, unspecified: Secondary | ICD-10-CM | POA: Diagnosis not present

## 2018-06-04 DIAGNOSIS — Z23 Encounter for immunization: Secondary | ICD-10-CM | POA: Diagnosis not present

## 2018-06-15 DIAGNOSIS — Z6827 Body mass index (BMI) 27.0-27.9, adult: Secondary | ICD-10-CM | POA: Diagnosis not present

## 2018-06-15 DIAGNOSIS — J449 Chronic obstructive pulmonary disease, unspecified: Secondary | ICD-10-CM | POA: Diagnosis not present

## 2018-06-15 DIAGNOSIS — M7711 Lateral epicondylitis, right elbow: Secondary | ICD-10-CM | POA: Diagnosis not present

## 2018-06-17 DIAGNOSIS — M7711 Lateral epicondylitis, right elbow: Secondary | ICD-10-CM | POA: Diagnosis not present

## 2018-06-17 DIAGNOSIS — R2231 Localized swelling, mass and lump, right upper limb: Secondary | ICD-10-CM | POA: Diagnosis not present

## 2018-07-07 DIAGNOSIS — S56511A Strain of other extensor muscle, fascia and tendon at forearm level, right arm, initial encounter: Secondary | ICD-10-CM | POA: Diagnosis not present

## 2018-07-07 DIAGNOSIS — R2231 Localized swelling, mass and lump, right upper limb: Secondary | ICD-10-CM | POA: Diagnosis not present

## 2018-07-08 DIAGNOSIS — M7711 Lateral epicondylitis, right elbow: Secondary | ICD-10-CM | POA: Diagnosis not present

## 2018-07-23 DIAGNOSIS — I2510 Atherosclerotic heart disease of native coronary artery without angina pectoris: Secondary | ICD-10-CM | POA: Diagnosis not present

## 2018-07-23 DIAGNOSIS — E785 Hyperlipidemia, unspecified: Secondary | ICD-10-CM | POA: Diagnosis not present

## 2018-07-23 DIAGNOSIS — R69 Illness, unspecified: Secondary | ICD-10-CM | POA: Diagnosis not present

## 2018-07-23 DIAGNOSIS — I10 Essential (primary) hypertension: Secondary | ICD-10-CM | POA: Diagnosis not present

## 2018-07-23 DIAGNOSIS — D519 Vitamin B12 deficiency anemia, unspecified: Secondary | ICD-10-CM | POA: Diagnosis not present

## 2018-07-23 DIAGNOSIS — N401 Enlarged prostate with lower urinary tract symptoms: Secondary | ICD-10-CM | POA: Diagnosis not present

## 2018-08-30 DIAGNOSIS — R69 Illness, unspecified: Secondary | ICD-10-CM | POA: Diagnosis not present

## 2018-09-16 ENCOUNTER — Telehealth: Payer: Self-pay | Admitting: Cardiovascular Disease

## 2018-09-16 DIAGNOSIS — T84038A Mechanical loosening of other internal prosthetic joint, initial encounter: Secondary | ICD-10-CM | POA: Diagnosis not present

## 2018-09-16 DIAGNOSIS — T8484XA Pain due to internal orthopedic prosthetic devices, implants and grafts, initial encounter: Secondary | ICD-10-CM | POA: Diagnosis not present

## 2018-09-16 NOTE — Telephone Encounter (Signed)
     [facility] Pre-operative Risk Assessment    Request for surgical clearance:  1. What type of surgery is being performed? recision of right knee  2. When is this surgery scheduled?TBD   3. What type of clearance is required (medical clearance vs. Pharmacy clearance to hold med vs. Both)? medical  4. Are there any medications that need to be held prior to surgery and how long? Not sure  5. Practice name and name of physician performing surgery? [facility], Dr Adin Hector   6. What is your office phone number [phone]   7.   What is your office fax number [phone]  8.   Anesthesia type (None, local, MAC, general) ? general   Justin Conley 09/16/2018, 3:50 PM  _________________________________________________________________   (provider comments below)

## 2018-09-18 DIAGNOSIS — K5909 Other constipation: Secondary | ICD-10-CM | POA: Diagnosis not present

## 2018-09-18 DIAGNOSIS — Z6827 Body mass index (BMI) 27.0-27.9, adult: Secondary | ICD-10-CM | POA: Diagnosis not present

## 2018-09-20 NOTE — Telephone Encounter (Signed)
Okay to interrupt aspirin for surgery.

## 2018-09-20 NOTE — Telephone Encounter (Signed)
   Primary Cardiologist: No primary care provider on file.  Chart reviewed as part of pre-operative protocol coverage. Patient was contacted 09/20/2018 in reference to pre-operative risk assessment for pending surgery as outlined below.  Justin Conley was last seen on 03/09/18 by Dr. Gwenlyn Found.  Since that day, Justin Conley has done well. No cardiac symptoms. He is walking 1 miles each day despite knee issue.   Therefore, based on ACC/AHA guidelines, the patient would be at acceptable risk for the planned procedure without further cardiovascular testing.   Dr. Gwenlyn Found, can patient hold ASA for 5-7 days prior to procedure if needed?  [place], [place] 09/20/2018, 9:59 AM

## 2018-09-22 ENCOUNTER — Telehealth: Payer: Self-pay

## 2018-09-22 DIAGNOSIS — M79609 Pain in unspecified limb: Secondary | ICD-10-CM | POA: Diagnosis not present

## 2018-09-22 DIAGNOSIS — Z79899 Other long term (current) drug therapy: Secondary | ICD-10-CM | POA: Diagnosis not present

## 2018-09-22 DIAGNOSIS — Z01818 Encounter for other preprocedural examination: Secondary | ICD-10-CM | POA: Diagnosis not present

## 2018-09-22 DIAGNOSIS — R52 Pain, unspecified: Secondary | ICD-10-CM | POA: Diagnosis not present

## 2018-09-22 DIAGNOSIS — Z01811 Encounter for preprocedural respiratory examination: Secondary | ICD-10-CM | POA: Diagnosis not present

## 2018-09-22 LAB — PROTIME-INR: INR: 1 (ref 0.9–1.1)

## 2018-09-22 MED ORDER — CLOPIDOGREL BISULFATE 75 MG PO TABS: 75.0000 mg | ORAL_TABLET | Freq: Every day | ORAL | Status: AC

## 2018-09-22 NOTE — Telephone Encounter (Signed)
Dr. Gwenlyn Found, now surgeon office calling regarding plavix. Can he hold for 5 days?  History of CAD status post RCA D DES at [facility] 12/09/2012.  per note, hx of stroke 10 years ago.   He does not need office visit for surgical clearance. He was doing well during my phone call.  He can follow up with as recalled in July 2020.

## 2018-09-22 NOTE — Telephone Encounter (Signed)
SHARON CALLED BACK AND STATES THAT PT NEED CLEARANCE TO STOP HIS PLAVIX. THIS HAS BEEN RX'D BY HIS PCP AND THE WIFE IS CALLING HIM TO SEE IF THIS NEEDS TO BE CLEARED BY [place] OR THE PCP. PT HAS APPT Monday 2-24 THAT WAS SCHEDULED BEFORE CLEARANCE WAS GRANTED. APPT STILL IN PLACE JUST IN CASE IT IS NEEDED TO DISCUSS THIS.  FORWARDING TO PREOP POOL FOR REVIEW

## 2018-09-22 NOTE — Telephone Encounter (Signed)
PT WIFE CALLED BACK AND STATES THAT PT TAKES PLAVIX. THIS IS NOT ON PT'S MEDICATION LIST AND IS NOT ON CARDIAC CLEARANCE OT  SPOKE WITH SHARON HOGAN SHE STATES THAT SHE NEEDS CLEARANCE TO STOP THE PLAVIX BEFORE SURGERY. WILL ADD TO PRE-OP NOTE AND POOL.

## 2018-09-22 NOTE — Telephone Encounter (Signed)
LM2CB PT HAS APPT ON Monday PT IS ALREADY CLEARED FOR SURGERY. WHAT IS APPT FOR?  SHARON [phone] DOES SHE STILL NEED ANYTHING FURTHER FOR CLEARANCE

## 2018-09-22 NOTE — Telephone Encounter (Signed)
Face-to-face consult with Dr. Gwenlyn Found regarding surgeon's office request to hold Plavix for 5 days prior to procedure. Dr. Gwenlyn Found stated that it is okay to hold Plavix for the amount of time requested. Will route to Dr. Gwenlyn Found for [facility] and to pre-op pool.

## 2018-09-23 ENCOUNTER — Telehealth: Payer: Self-pay

## 2018-09-23 NOTE — Telephone Encounter (Signed)
   Primary Cardiologist:Jonathan Gwenlyn Found, MD  Chart reviewed as part of pre-operative protocol coverage. Because of Justin Conley's past medical history and time since last visit, he/she will require a follow-up visit in order to better assess preoperative cardiovascular risk.  Pre-op covering staff: - Please schedule appointment and call patient to inform them. - Please contact requesting surgeon's office via preferred method (i.e, phone, fax) to inform them of need for appointment prior to surgery.  If applicable, this message will also be routed to pharmacy pool and/or primary cardiologist for input on holding anticoagulant/antiplatelet agent as requested below so that this information is available at time of patient's appointment.   Lyda Jester, PA-C  09/23/2018, 11:49 AM

## 2018-09-23 NOTE — Telephone Encounter (Signed)
Entered in error. This is a duplicate request.   Please see initial telephone encounter from 09/16/18. Pt was contacted by phone by Gilberto Better, PA-C, and doing well. Pt cleared to proceed w/ surgery, but we are still awaiting recs from Dr. Gwenlyn Found regarding Plavix and ASA.

## 2018-09-23 NOTE — Telephone Encounter (Signed)
   [facility] Pre-operative Risk Assessment    Request for surgical clearance:  1. What type of surgery is being performed? Right total knee arthroplasty  2. When is this surgery scheduled? TBD  3. What type of clearance is required (medical clearance vs. Pharmacy clearance to hold med vs. Both)? Both  4. Are there any medications that need to be held prior to surgery and how long? Plavix and Aspirin  5. Practice name and name of physician performing surgery? Jonesville Hubler DO  6. What is your office phone number [phone]   7.   What is your office fax number [phone]  8.   Anesthesia type General   Kathyrn Lass 09/23/2018, 11:41 AM  _________________________________________________________________   (provider comments below)

## 2018-09-24 NOTE — Telephone Encounter (Signed)
Okay to hold his antiplatelet therapy for his knee arthroplasty.

## 2018-09-24 NOTE — Telephone Encounter (Signed)
   Primary Cardiologist: Quay Burow, MD  Chart reviewed as part of pre-operative protocol coverage. This  Patient was cleared medically by Leanor Kail, PA on 09/20/18. Per Dr. Gwenlyn Found, patient my interrupt ASA for 5-7 days and may hold plavix for 5 days prior to procedure.   Therefore, based on ACC/AHA guidelines, the patient would be at acceptable risk for the planned procedure without further cardiovascular testing.   I will route this recommendation to the requesting party via Epic fax function and remove from pre-op pool.  Please call with questions.  Tami Lin Keerthi Hazell, PA 09/24/2018, 7:32 AM

## 2018-09-27 ENCOUNTER — Ambulatory Visit: Payer: Medicare HMO | Admitting: Adult Health

## 2018-10-13 DIAGNOSIS — Z882 Allergy status to sulfonamides status: Secondary | ICD-10-CM | POA: Diagnosis not present

## 2018-10-13 DIAGNOSIS — G8918 Other acute postprocedural pain: Secondary | ICD-10-CM | POA: Diagnosis not present

## 2018-10-13 DIAGNOSIS — I10 Essential (primary) hypertension: Secondary | ICD-10-CM | POA: Diagnosis not present

## 2018-10-13 DIAGNOSIS — D62 Acute posthemorrhagic anemia: Secondary | ICD-10-CM | POA: Diagnosis not present

## 2018-10-13 DIAGNOSIS — M1990 Unspecified osteoarthritis, unspecified site: Secondary | ICD-10-CM | POA: Diagnosis not present

## 2018-10-13 DIAGNOSIS — N183 Chronic kidney disease, stage 3 (moderate): Secondary | ICD-10-CM | POA: Diagnosis not present

## 2018-10-13 DIAGNOSIS — Z471 Aftercare following joint replacement surgery: Secondary | ICD-10-CM | POA: Diagnosis not present

## 2018-10-13 DIAGNOSIS — Y838 Other surgical procedures as the cause of abnormal reaction of the patient, or of later complication, without mention of misadventure at the time of the procedure: Secondary | ICD-10-CM | POA: Diagnosis not present

## 2018-10-13 DIAGNOSIS — J449 Chronic obstructive pulmonary disease, unspecified: Secondary | ICD-10-CM | POA: Diagnosis not present

## 2018-10-13 DIAGNOSIS — T84012A Broken internal right knee prosthesis, initial encounter: Secondary | ICD-10-CM | POA: Diagnosis not present

## 2018-10-13 DIAGNOSIS — Z8673 Personal history of transient ischemic attack (TIA), and cerebral infarction without residual deficits: Secondary | ICD-10-CM | POA: Diagnosis not present

## 2018-10-13 DIAGNOSIS — R69 Illness, unspecified: Secondary | ICD-10-CM | POA: Diagnosis not present

## 2018-10-13 DIAGNOSIS — T84032A Mechanical loosening of internal right knee prosthetic joint, initial encounter: Secondary | ICD-10-CM | POA: Diagnosis not present

## 2018-10-13 DIAGNOSIS — I2510 Atherosclerotic heart disease of native coronary artery without angina pectoris: Secondary | ICD-10-CM | POA: Diagnosis not present

## 2018-10-13 DIAGNOSIS — I1310 Hypertensive heart and chronic kidney disease without heart failure, with stage 1 through stage 4 chronic kidney disease, or unspecified chronic kidney disease: Secondary | ICD-10-CM | POA: Diagnosis not present

## 2018-10-13 DIAGNOSIS — Z96651 Presence of right artificial knee joint: Secondary | ICD-10-CM | POA: Diagnosis not present

## 2018-10-13 DIAGNOSIS — Z96659 Presence of unspecified artificial knee joint: Secondary | ICD-10-CM | POA: Diagnosis not present

## 2018-10-13 DIAGNOSIS — E785 Hyperlipidemia, unspecified: Secondary | ICD-10-CM | POA: Diagnosis not present

## 2018-10-17 DIAGNOSIS — I2510 Atherosclerotic heart disease of native coronary artery without angina pectoris: Secondary | ICD-10-CM | POA: Diagnosis not present

## 2018-10-17 DIAGNOSIS — J449 Chronic obstructive pulmonary disease, unspecified: Secondary | ICD-10-CM | POA: Diagnosis not present

## 2018-10-17 DIAGNOSIS — Z8673 Personal history of transient ischemic attack (TIA), and cerebral infarction without residual deficits: Secondary | ICD-10-CM | POA: Diagnosis not present

## 2018-10-17 DIAGNOSIS — I10 Essential (primary) hypertension: Secondary | ICD-10-CM | POA: Diagnosis not present

## 2018-10-17 DIAGNOSIS — Z79891 Long term (current) use of opiate analgesic: Secondary | ICD-10-CM | POA: Diagnosis not present

## 2018-10-17 DIAGNOSIS — Z9181 History of falling: Secondary | ICD-10-CM | POA: Diagnosis not present

## 2018-10-17 DIAGNOSIS — Z7902 Long term (current) use of antithrombotics/antiplatelets: Secondary | ICD-10-CM | POA: Diagnosis not present

## 2018-10-17 DIAGNOSIS — T84032D Mechanical loosening of internal right knee prosthetic joint, subsequent encounter: Secondary | ICD-10-CM | POA: Diagnosis not present

## 2018-10-17 DIAGNOSIS — Z7982 Long term (current) use of aspirin: Secondary | ICD-10-CM | POA: Diagnosis not present

## 2018-10-17 DIAGNOSIS — M1991 Primary osteoarthritis, unspecified site: Secondary | ICD-10-CM | POA: Diagnosis not present

## 2018-10-22 DIAGNOSIS — T84032D Mechanical loosening of internal right knee prosthetic joint, subsequent encounter: Secondary | ICD-10-CM | POA: Diagnosis not present

## 2018-10-27 DIAGNOSIS — R2689 Other abnormalities of gait and mobility: Secondary | ICD-10-CM | POA: Diagnosis not present

## 2018-10-27 DIAGNOSIS — M25561 Pain in right knee: Secondary | ICD-10-CM | POA: Diagnosis not present

## 2018-10-27 DIAGNOSIS — Z96651 Presence of right artificial knee joint: Secondary | ICD-10-CM | POA: Diagnosis not present

## 2018-11-03 DIAGNOSIS — R2689 Other abnormalities of gait and mobility: Secondary | ICD-10-CM | POA: Diagnosis not present

## 2018-11-03 DIAGNOSIS — M25561 Pain in right knee: Secondary | ICD-10-CM | POA: Diagnosis not present

## 2018-11-03 DIAGNOSIS — Z96651 Presence of right artificial knee joint: Secondary | ICD-10-CM | POA: Diagnosis not present

## 2018-11-10 DIAGNOSIS — Z96651 Presence of right artificial knee joint: Secondary | ICD-10-CM | POA: Diagnosis not present

## 2018-11-10 DIAGNOSIS — M25561 Pain in right knee: Secondary | ICD-10-CM | POA: Diagnosis not present

## 2018-11-10 DIAGNOSIS — R2689 Other abnormalities of gait and mobility: Secondary | ICD-10-CM | POA: Diagnosis not present

## 2018-11-17 DIAGNOSIS — R2689 Other abnormalities of gait and mobility: Secondary | ICD-10-CM | POA: Diagnosis not present

## 2018-11-17 DIAGNOSIS — Z96651 Presence of right artificial knee joint: Secondary | ICD-10-CM | POA: Diagnosis not present

## 2018-11-17 DIAGNOSIS — M25561 Pain in right knee: Secondary | ICD-10-CM | POA: Diagnosis not present

## 2018-11-25 DIAGNOSIS — T84038A Mechanical loosening of other internal prosthetic joint, initial encounter: Secondary | ICD-10-CM | POA: Diagnosis not present

## 2019-01-07 DIAGNOSIS — M79604 Pain in right leg: Secondary | ICD-10-CM | POA: Diagnosis not present

## 2019-01-07 DIAGNOSIS — Z96651 Presence of right artificial knee joint: Secondary | ICD-10-CM | POA: Diagnosis not present

## 2019-01-12 DIAGNOSIS — M25561 Pain in right knee: Secondary | ICD-10-CM | POA: Diagnosis not present

## 2019-01-12 DIAGNOSIS — Z96651 Presence of right artificial knee joint: Secondary | ICD-10-CM | POA: Diagnosis not present

## 2019-01-12 DIAGNOSIS — M25461 Effusion, right knee: Secondary | ICD-10-CM | POA: Diagnosis not present

## 2019-01-17 ENCOUNTER — Telehealth: Payer: Self-pay | Admitting: *Deleted

## 2019-01-17 NOTE — Telephone Encounter (Signed)
Mr. Culp wife will call back in August to schedule his appointment.

## 2019-02-10 DIAGNOSIS — Z9861 Coronary angioplasty status: Secondary | ICD-10-CM | POA: Diagnosis not present

## 2019-02-10 DIAGNOSIS — I2510 Atherosclerotic heart disease of native coronary artery without angina pectoris: Secondary | ICD-10-CM | POA: Diagnosis not present

## 2019-02-10 DIAGNOSIS — N401 Enlarged prostate with lower urinary tract symptoms: Secondary | ICD-10-CM | POA: Diagnosis not present

## 2019-02-10 DIAGNOSIS — E785 Hyperlipidemia, unspecified: Secondary | ICD-10-CM | POA: Diagnosis not present

## 2019-02-10 DIAGNOSIS — D519 Vitamin B12 deficiency anemia, unspecified: Secondary | ICD-10-CM | POA: Diagnosis not present

## 2019-02-10 DIAGNOSIS — R69 Illness, unspecified: Secondary | ICD-10-CM | POA: Diagnosis not present

## 2019-02-10 DIAGNOSIS — G5791 Unspecified mononeuropathy of right lower limb: Secondary | ICD-10-CM | POA: Diagnosis not present

## 2019-02-24 DIAGNOSIS — D500 Iron deficiency anemia secondary to blood loss (chronic): Secondary | ICD-10-CM | POA: Diagnosis not present

## 2019-02-24 DIAGNOSIS — D518 Other vitamin B12 deficiency anemias: Secondary | ICD-10-CM | POA: Diagnosis not present

## 2019-02-25 DIAGNOSIS — D500 Iron deficiency anemia secondary to blood loss (chronic): Secondary | ICD-10-CM | POA: Diagnosis not present

## 2019-03-14 ENCOUNTER — Telehealth: Payer: Self-pay | Admitting: Cardiovascular Disease

## 2019-03-14 NOTE — Telephone Encounter (Signed)
That is fine with me.

## 2019-03-14 NOTE — Telephone Encounter (Signed)
Please advise if okay to come to appointment. Thanks!

## 2019-03-14 NOTE — Telephone Encounter (Signed)
Wife aware and notes placed on appointment.

## 2019-03-14 NOTE — Telephone Encounter (Signed)
New message   Per wife would like to come into the office with husband on next visit. She states that her husband does not comprehend well. Please advise.

## 2019-03-16 ENCOUNTER — Ambulatory Visit: Payer: Medicare HMO | Admitting: Cardiovascular Disease

## 2019-03-16 ENCOUNTER — Encounter: Payer: Self-pay | Admitting: Cardiovascular Disease

## 2019-03-16 ENCOUNTER — Other Ambulatory Visit: Payer: Self-pay

## 2019-03-16 DIAGNOSIS — E782 Mixed hyperlipidemia: Secondary | ICD-10-CM

## 2019-03-16 DIAGNOSIS — I2510 Atherosclerotic heart disease of native coronary artery without angina pectoris: Secondary | ICD-10-CM

## 2019-03-16 DIAGNOSIS — I10 Essential (primary) hypertension: Secondary | ICD-10-CM

## 2019-03-16 DIAGNOSIS — Z720 Tobacco use: Secondary | ICD-10-CM

## 2019-03-16 NOTE — Assessment & Plan Note (Signed)
History of CAD status post mid RCA drug-eluting stenting 12/09/2012 at [facility] hospital.  Myoview stress test performed 03/05/2014 was nonischemic.  He denies chest pain.

## 2019-03-16 NOTE — Patient Instructions (Signed)

## 2019-03-16 NOTE — Assessment & Plan Note (Signed)
History of hypertension with blood pressure measured today 134/62 he is on valsartan.  Continue current meds at current dosing

## 2019-03-16 NOTE — Assessment & Plan Note (Addendum)
History of continued tobacco abuse of 1 pack/day recalcitrant to risk factor modification

## 2019-03-16 NOTE — Assessment & Plan Note (Signed)
History of hyperlipidemia on statin therapy with lipid profile performed 02/10/2019 revealing total cholesterol 107, LDL 46 and HDL 35

## 2019-03-16 NOTE — Progress Notes (Signed)
03/16/2019 CHARVEZ VOORHIES   22-Jul-1941  [id number]  Primary Physician Nicoletta Dress, MD Primary Cardiologist: Lorretta Harp MD Lupe Carney, [place]  HPI:  MACEO Conley is a 78 y.o.  mildly overweight married Caucasian male father of 4 children, grandfather of8 grandchildrenwho is accompanied by his wife Justin Conley today. 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 He was referred by Dr. Ilda Basset in [place] to be established in our practice.I last saw him in the office  12/07/2017.  His pain risk factors include treated hypertension, hyperlipidemia and 50-pack-years of tobacco abuse continuing to smoke one pack per day. He does have a family history of heart disease in the father who died of a myocardial infarction and 3 siblings who had CAD as well. He had a stroke 10 years ago that has not left him with any deficits. He had a drug-eluting stent placed in his mid RCA 12/09/12 at [facility]. Does occasionally get nocturnal chest pain which is nitrate responsive. He had a Myoview stress test performed in our office 03/06/15 which was low risk.  Since I saw him a year ago he is remained stable.  He denies chest pain or shortness of breath.  He was considering having his ventral hernia repaired but this never occurred.  Dr. Delena Bali follows his lipid profile.   Current Meds  Medication Sig  . aspirin EC 81 MG tablet Take 81 mg by mouth.  Marland Kitchen atorvastatin (LIPITOR) 40 MG tablet Take 40 mg by mouth daily.  . citalopram (CELEXA) 20 MG tablet Take 20 mg by mouth daily.  . clopidogrel (PLAVIX) 75 MG tablet Take 1 tablet (75 mg total) by mouth daily.  . cyanocobalamin (,VITAMIN B-12,) 1000 MCG/ML injection Inject 1 mL into the muscle every 30 (thirty) days.  Marland Kitchen donepezil (ARICEPT) 5 MG tablet Take 1 tablet by mouth daily.  . Ferrous Sulfate (IRON) 325 (65 Fe) MG TABS Take 2 tablets by mouth daily.  Marland Kitchen PROAIR HFA 108 (90  Base) MCG/ACT inhaler Inhale 2 puffs into the lungs as directed.  . Probiotic Product (PROBIOTIC-10 PO) Take 1 tablet by mouth at bedtime.  . valsartan (DIOVAN) 320 MG tablet Take 320 mg by mouth.     Allergies  Allergen Reactions  . Ace Inhibitors Cough    Social History   Socioeconomic History  . Marital status: Married    Spouse name: Not on file  . Number of children: Not on file  . Years of education: Not on file  . Highest education level: Not on file  Occupational History  . Not on file  Social Needs  . Financial resource strain: Not on file  . Food insecurity    Worry: Not on file    Inability: Not on file  . Transportation needs    Medical: Not on file    Non-medical: Not on file  Tobacco Use  . Smoking status: Current Every Day Smoker  . Smokeless tobacco: Never Used  Substance and Sexual Activity  . Alcohol use: Not on file  . Drug use: Not on file  . Sexual activity: Not on file  Lifestyle  . Physical activity    Days per week: Not on file    Minutes per session: Not on file  . Stress: Not on file  Relationships  . Social Herbalist on phone: Not on file    Gets  together: Not on file    Attends religious service: Not on file    Active member of club or organization: Not on file    Attends meetings of clubs or organizations: Not on file    Relationship status: Not on file  . Intimate partner violence    Fear of current or ex partner: Not on file    Emotionally abused: Not on file    Physically abused: Not on file    Forced sexual activity: Not on file  Other Topics Concern  . Not on file  Social History Narrative  . Not on file     Review of Systems: General: negative for chills, fever, night sweats or weight changes.  Cardiovascular: negative for chest pain, dyspnea on exertion, edema, orthopnea, palpitations, paroxysmal nocturnal dyspnea or shortness of breath Dermatological: negative for rash Respiratory: negative for cough or  wheezing Urologic: negative for hematuria Abdominal: negative for nausea, vomiting, diarrhea, bright red blood per rectum, melena, or hematemesis Neurologic: negative for visual changes, syncope, or dizziness All other systems reviewed and are otherwise negative except as noted above.    Blood pressure 134/62, pulse 66, temperature (!) 97.5 F (36.4 C), temperature source Temporal, height 5' 6.5" (1.689 m), weight 181 lb 6.4 oz (82.3 kg).  General appearance: alert and no distress Neck: no adenopathy, no carotid bruit, no JVD, supple, symmetrical, trachea midline and thyroid not enlarged, symmetric, no tenderness/mass/nodules Lungs: clear to auscultation bilaterally Heart: regular rate and rhythm, S1, S2 normal, no murmur, click, rub or gallop Extremities: extremities normal, atraumatic, no cyanosis or edema Pulses: 2+ and symmetric Skin: Skin color, texture, turgor normal. No rashes or lesions Neurologic: Alert and oriented X 3, normal strength and tone. Normal symmetric reflexes. Normal coordination and gait  EKG sinus rhythm at 66 without ST or T wave changes.  I personally reviewed this EKG.  ASSESSMENT AND PLAN:   Essential hypertension History of hypertension with blood pressure measured today 134/62 he is on valsartan.  Continue current meds at current dosing  Hyperlipidemia History of hyperlipidemia on statin therapy with lipid profile performed 02/10/2019 revealing total cholesterol 107, LDL 46 and HDL 35  Tobacco abuse History of continued tobacco abuse of 1 pack/day recalcitrant to risk factor modification  Coronary artery disease History of CAD status post mid RCA drug-eluting stenting 12/09/2012 at [facility].  Myoview stress test performed 03/05/2014 was nonischemic.  He denies chest pain.      Lorretta Harp MD FACP,FACC,FAHA, [facility] 03/16/2019 3:09 PM

## 2019-03-27 DIAGNOSIS — S61411A Laceration without foreign body of right hand, initial encounter: Secondary | ICD-10-CM | POA: Diagnosis not present

## 2019-03-30 DIAGNOSIS — D649 Anemia, unspecified: Secondary | ICD-10-CM | POA: Diagnosis not present

## 2019-05-09 DIAGNOSIS — D649 Anemia, unspecified: Secondary | ICD-10-CM | POA: Diagnosis not present

## 2019-05-18 DIAGNOSIS — K5730 Diverticulosis of large intestine without perforation or abscess without bleeding: Secondary | ICD-10-CM | POA: Diagnosis not present

## 2019-05-18 DIAGNOSIS — D500 Iron deficiency anemia secondary to blood loss (chronic): Secondary | ICD-10-CM | POA: Diagnosis not present

## 2019-05-18 DIAGNOSIS — K219 Gastro-esophageal reflux disease without esophagitis: Secondary | ICD-10-CM | POA: Diagnosis not present

## 2019-05-31 DIAGNOSIS — K5730 Diverticulosis of large intestine without perforation or abscess without bleeding: Secondary | ICD-10-CM | POA: Diagnosis not present

## 2019-05-31 DIAGNOSIS — J449 Chronic obstructive pulmonary disease, unspecified: Secondary | ICD-10-CM | POA: Diagnosis not present

## 2019-05-31 DIAGNOSIS — D509 Iron deficiency anemia, unspecified: Secondary | ICD-10-CM | POA: Diagnosis not present

## 2019-05-31 DIAGNOSIS — K635 Polyp of colon: Secondary | ICD-10-CM | POA: Diagnosis not present

## 2019-05-31 DIAGNOSIS — Z1211 Encounter for screening for malignant neoplasm of colon: Secondary | ICD-10-CM | POA: Diagnosis not present

## 2019-05-31 DIAGNOSIS — K5750 Diverticulosis of both small and large intestine without perforation or abscess without bleeding: Secondary | ICD-10-CM | POA: Diagnosis not present

## 2019-05-31 DIAGNOSIS — K219 Gastro-esophageal reflux disease without esophagitis: Secondary | ICD-10-CM | POA: Diagnosis not present

## 2019-05-31 DIAGNOSIS — K648 Other hemorrhoids: Secondary | ICD-10-CM | POA: Diagnosis not present

## 2019-05-31 DIAGNOSIS — D500 Iron deficiency anemia secondary to blood loss (chronic): Secondary | ICD-10-CM | POA: Diagnosis not present

## 2019-05-31 DIAGNOSIS — D126 Benign neoplasm of colon, unspecified: Secondary | ICD-10-CM | POA: Diagnosis not present

## 2019-05-31 DIAGNOSIS — D122 Benign neoplasm of ascending colon: Secondary | ICD-10-CM | POA: Diagnosis not present

## 2019-06-13 DIAGNOSIS — R69 Illness, unspecified: Secondary | ICD-10-CM | POA: Diagnosis not present

## 2019-06-20 DIAGNOSIS — J3489 Other specified disorders of nose and nasal sinuses: Secondary | ICD-10-CM | POA: Diagnosis not present

## 2019-06-20 DIAGNOSIS — H6123 Impacted cerumen, bilateral: Secondary | ICD-10-CM | POA: Diagnosis not present

## 2019-08-08 DIAGNOSIS — D649 Anemia, unspecified: Secondary | ICD-10-CM | POA: Diagnosis not present

## 2019-08-08 DIAGNOSIS — R69 Illness, unspecified: Secondary | ICD-10-CM | POA: Diagnosis not present

## 2019-08-15 DIAGNOSIS — J449 Chronic obstructive pulmonary disease, unspecified: Secondary | ICD-10-CM | POA: Diagnosis not present

## 2019-08-15 DIAGNOSIS — D509 Iron deficiency anemia, unspecified: Secondary | ICD-10-CM | POA: Diagnosis not present

## 2019-08-15 DIAGNOSIS — Z9861 Coronary angioplasty status: Secondary | ICD-10-CM | POA: Diagnosis not present

## 2019-08-15 DIAGNOSIS — Z125 Encounter for screening for malignant neoplasm of prostate: Secondary | ICD-10-CM | POA: Diagnosis not present

## 2019-08-15 DIAGNOSIS — I2510 Atherosclerotic heart disease of native coronary artery without angina pectoris: Secondary | ICD-10-CM | POA: Diagnosis not present

## 2019-08-15 DIAGNOSIS — D519 Vitamin B12 deficiency anemia, unspecified: Secondary | ICD-10-CM | POA: Diagnosis not present

## 2019-08-15 DIAGNOSIS — Z139 Encounter for screening, unspecified: Secondary | ICD-10-CM | POA: Diagnosis not present

## 2019-08-15 DIAGNOSIS — E785 Hyperlipidemia, unspecified: Secondary | ICD-10-CM | POA: Diagnosis not present

## 2019-08-15 DIAGNOSIS — Z9181 History of falling: Secondary | ICD-10-CM | POA: Diagnosis not present

## 2019-08-15 DIAGNOSIS — R69 Illness, unspecified: Secondary | ICD-10-CM | POA: Diagnosis not present

## 2019-08-15 DIAGNOSIS — Z1331 Encounter for screening for depression: Secondary | ICD-10-CM | POA: Diagnosis not present

## 2019-08-15 DIAGNOSIS — Z0000 Encounter for general adult medical examination without abnormal findings: Secondary | ICD-10-CM | POA: Diagnosis not present

## 2019-08-17 DIAGNOSIS — K5730 Diverticulosis of large intestine without perforation or abscess without bleeding: Secondary | ICD-10-CM | POA: Diagnosis not present

## 2019-08-17 DIAGNOSIS — D509 Iron deficiency anemia, unspecified: Secondary | ICD-10-CM | POA: Diagnosis not present

## 2019-08-17 DIAGNOSIS — K219 Gastro-esophageal reflux disease without esophagitis: Secondary | ICD-10-CM | POA: Diagnosis not present

## 2019-10-05 DIAGNOSIS — M7501 Adhesive capsulitis of right shoulder: Secondary | ICD-10-CM | POA: Diagnosis not present

## 2019-10-07 DIAGNOSIS — G3184 Mild cognitive impairment, so stated: Secondary | ICD-10-CM | POA: Diagnosis not present

## 2019-10-07 DIAGNOSIS — R32 Unspecified urinary incontinence: Secondary | ICD-10-CM | POA: Diagnosis not present

## 2019-10-07 DIAGNOSIS — I2510 Atherosclerotic heart disease of native coronary artery without angina pectoris: Secondary | ICD-10-CM | POA: Diagnosis not present

## 2019-10-07 DIAGNOSIS — N529 Male erectile dysfunction, unspecified: Secondary | ICD-10-CM | POA: Diagnosis not present

## 2019-10-07 DIAGNOSIS — R69 Illness, unspecified: Secondary | ICD-10-CM | POA: Diagnosis not present

## 2019-10-07 DIAGNOSIS — I10 Essential (primary) hypertension: Secondary | ICD-10-CM | POA: Diagnosis not present

## 2019-10-07 DIAGNOSIS — J449 Chronic obstructive pulmonary disease, unspecified: Secondary | ICD-10-CM | POA: Diagnosis not present

## 2019-10-07 DIAGNOSIS — M1990 Unspecified osteoarthritis, unspecified site: Secondary | ICD-10-CM | POA: Diagnosis not present

## 2019-10-07 DIAGNOSIS — Z008 Encounter for other general examination: Secondary | ICD-10-CM | POA: Diagnosis not present

## 2019-10-07 DIAGNOSIS — E785 Hyperlipidemia, unspecified: Secondary | ICD-10-CM | POA: Diagnosis not present

## 2019-12-07 DIAGNOSIS — D649 Anemia, unspecified: Secondary | ICD-10-CM | POA: Diagnosis not present

## 2019-12-07 DIAGNOSIS — K921 Melena: Secondary | ICD-10-CM | POA: Diagnosis not present

## 2019-12-08 DIAGNOSIS — R319 Hematuria, unspecified: Secondary | ICD-10-CM | POA: Diagnosis not present

## 2019-12-14 DIAGNOSIS — R933 Abnormal findings on diagnostic imaging of other parts of digestive tract: Secondary | ICD-10-CM | POA: Diagnosis not present

## 2019-12-14 DIAGNOSIS — R319 Hematuria, unspecified: Secondary | ICD-10-CM | POA: Diagnosis not present

## 2019-12-14 DIAGNOSIS — K5730 Diverticulosis of large intestine without perforation or abscess without bleeding: Secondary | ICD-10-CM | POA: Diagnosis not present

## 2019-12-14 DIAGNOSIS — R9341 Abnormal radiologic findings on diagnostic imaging of renal pelvis, ureter, or bladder: Secondary | ICD-10-CM | POA: Diagnosis not present

## 2019-12-14 DIAGNOSIS — R310 Gross hematuria: Secondary | ICD-10-CM | POA: Diagnosis not present

## 2019-12-14 DIAGNOSIS — I700 Atherosclerosis of aorta: Secondary | ICD-10-CM | POA: Diagnosis not present

## 2019-12-20 DIAGNOSIS — R310 Gross hematuria: Secondary | ICD-10-CM | POA: Diagnosis not present

## 2019-12-20 DIAGNOSIS — R351 Nocturia: Secondary | ICD-10-CM | POA: Diagnosis not present

## 2019-12-20 DIAGNOSIS — R350 Frequency of micturition: Secondary | ICD-10-CM | POA: Diagnosis not present

## 2020-01-23 DIAGNOSIS — M7502 Adhesive capsulitis of left shoulder: Secondary | ICD-10-CM | POA: Diagnosis not present

## 2020-01-24 DIAGNOSIS — S40812A Abrasion of left upper arm, initial encounter: Secondary | ICD-10-CM | POA: Diagnosis not present

## 2020-01-31 DIAGNOSIS — M6281 Muscle weakness (generalized): Secondary | ICD-10-CM | POA: Diagnosis not present

## 2020-01-31 DIAGNOSIS — M25612 Stiffness of left shoulder, not elsewhere classified: Secondary | ICD-10-CM | POA: Diagnosis not present

## 2020-01-31 DIAGNOSIS — M25512 Pain in left shoulder: Secondary | ICD-10-CM | POA: Diagnosis not present

## 2020-02-06 DIAGNOSIS — R69 Illness, unspecified: Secondary | ICD-10-CM | POA: Diagnosis not present

## 2020-02-13 DIAGNOSIS — R69 Illness, unspecified: Secondary | ICD-10-CM | POA: Diagnosis not present

## 2020-02-13 DIAGNOSIS — N401 Enlarged prostate with lower urinary tract symptoms: Secondary | ICD-10-CM | POA: Diagnosis not present

## 2020-02-13 DIAGNOSIS — I2510 Atherosclerotic heart disease of native coronary artery without angina pectoris: Secondary | ICD-10-CM | POA: Diagnosis not present

## 2020-02-13 DIAGNOSIS — D509 Iron deficiency anemia, unspecified: Secondary | ICD-10-CM | POA: Diagnosis not present

## 2020-02-13 DIAGNOSIS — G5791 Unspecified mononeuropathy of right lower limb: Secondary | ICD-10-CM | POA: Diagnosis not present

## 2020-02-13 DIAGNOSIS — R05 Cough: Secondary | ICD-10-CM | POA: Diagnosis not present

## 2020-02-13 DIAGNOSIS — E785 Hyperlipidemia, unspecified: Secondary | ICD-10-CM | POA: Diagnosis not present

## 2020-02-13 DIAGNOSIS — D519 Vitamin B12 deficiency anemia, unspecified: Secondary | ICD-10-CM | POA: Diagnosis not present

## 2020-02-13 DIAGNOSIS — Z6827 Body mass index (BMI) 27.0-27.9, adult: Secondary | ICD-10-CM | POA: Diagnosis not present

## 2020-02-13 DIAGNOSIS — Z9861 Coronary angioplasty status: Secondary | ICD-10-CM | POA: Diagnosis not present

## 2020-05-22 DIAGNOSIS — Z961 Presence of intraocular lens: Secondary | ICD-10-CM | POA: Diagnosis not present

## 2020-06-05 DIAGNOSIS — L918 Other hypertrophic disorders of the skin: Secondary | ICD-10-CM | POA: Diagnosis not present

## 2020-06-05 DIAGNOSIS — L570 Actinic keratosis: Secondary | ICD-10-CM | POA: Diagnosis not present

## 2020-06-19 DIAGNOSIS — R509 Fever, unspecified: Secondary | ICD-10-CM | POA: Diagnosis not present

## 2020-06-19 DIAGNOSIS — Z20828 Contact with and (suspected) exposure to other viral communicable diseases: Secondary | ICD-10-CM | POA: Diagnosis not present

## 2020-06-19 DIAGNOSIS — K591 Functional diarrhea: Secondary | ICD-10-CM | POA: Diagnosis not present

## 2020-08-15 DIAGNOSIS — E785 Hyperlipidemia, unspecified: Secondary | ICD-10-CM | POA: Diagnosis not present

## 2020-08-15 DIAGNOSIS — Z9181 History of falling: Secondary | ICD-10-CM | POA: Diagnosis not present

## 2020-08-15 DIAGNOSIS — Z0000 Encounter for general adult medical examination without abnormal findings: Secondary | ICD-10-CM | POA: Diagnosis not present

## 2020-08-15 DIAGNOSIS — Z1331 Encounter for screening for depression: Secondary | ICD-10-CM | POA: Diagnosis not present

## 2020-08-21 DIAGNOSIS — Z125 Encounter for screening for malignant neoplasm of prostate: Secondary | ICD-10-CM | POA: Diagnosis not present

## 2020-08-21 DIAGNOSIS — Z9861 Coronary angioplasty status: Secondary | ICD-10-CM | POA: Diagnosis not present

## 2020-08-21 DIAGNOSIS — E785 Hyperlipidemia, unspecified: Secondary | ICD-10-CM | POA: Diagnosis not present

## 2020-08-21 DIAGNOSIS — R69 Illness, unspecified: Secondary | ICD-10-CM | POA: Diagnosis not present

## 2020-08-21 DIAGNOSIS — D519 Vitamin B12 deficiency anemia, unspecified: Secondary | ICD-10-CM | POA: Diagnosis not present

## 2020-08-21 DIAGNOSIS — D509 Iron deficiency anemia, unspecified: Secondary | ICD-10-CM | POA: Diagnosis not present

## 2020-08-21 DIAGNOSIS — I2510 Atherosclerotic heart disease of native coronary artery without angina pectoris: Secondary | ICD-10-CM | POA: Diagnosis not present

## 2020-08-21 DIAGNOSIS — Z6826 Body mass index (BMI) 26.0-26.9, adult: Secondary | ICD-10-CM | POA: Diagnosis not present

## 2020-08-21 DIAGNOSIS — N401 Enlarged prostate with lower urinary tract symptoms: Secondary | ICD-10-CM | POA: Diagnosis not present

## 2020-08-22 DIAGNOSIS — Z125 Encounter for screening for malignant neoplasm of prostate: Secondary | ICD-10-CM | POA: Diagnosis not present

## 2020-08-22 DIAGNOSIS — D519 Vitamin B12 deficiency anemia, unspecified: Secondary | ICD-10-CM | POA: Diagnosis not present

## 2020-08-22 DIAGNOSIS — E785 Hyperlipidemia, unspecified: Secondary | ICD-10-CM | POA: Diagnosis not present

## 2020-08-22 DIAGNOSIS — D509 Iron deficiency anemia, unspecified: Secondary | ICD-10-CM | POA: Diagnosis not present

## 2020-09-25 DIAGNOSIS — J449 Chronic obstructive pulmonary disease, unspecified: Secondary | ICD-10-CM | POA: Diagnosis not present

## 2020-09-25 DIAGNOSIS — N529 Male erectile dysfunction, unspecified: Secondary | ICD-10-CM | POA: Diagnosis not present

## 2020-09-25 DIAGNOSIS — I25119 Atherosclerotic heart disease of native coronary artery with unspecified angina pectoris: Secondary | ICD-10-CM | POA: Diagnosis not present

## 2020-09-25 DIAGNOSIS — E785 Hyperlipidemia, unspecified: Secondary | ICD-10-CM | POA: Diagnosis not present

## 2020-09-25 DIAGNOSIS — R69 Illness, unspecified: Secondary | ICD-10-CM | POA: Diagnosis not present

## 2020-09-25 DIAGNOSIS — M1990 Unspecified osteoarthritis, unspecified site: Secondary | ICD-10-CM | POA: Diagnosis not present

## 2020-09-25 DIAGNOSIS — K219 Gastro-esophageal reflux disease without esophagitis: Secondary | ICD-10-CM | POA: Diagnosis not present

## 2020-09-25 DIAGNOSIS — Z008 Encounter for other general examination: Secondary | ICD-10-CM | POA: Diagnosis not present

## 2020-09-25 DIAGNOSIS — E663 Overweight: Secondary | ICD-10-CM | POA: Diagnosis not present

## 2020-09-25 DIAGNOSIS — I10 Essential (primary) hypertension: Secondary | ICD-10-CM | POA: Diagnosis not present

## 2020-10-31 DIAGNOSIS — M19042 Primary osteoarthritis, left hand: Secondary | ICD-10-CM | POA: Diagnosis not present

## 2020-10-31 DIAGNOSIS — R29898 Other symptoms and signs involving the musculoskeletal system: Secondary | ICD-10-CM | POA: Diagnosis not present

## 2020-10-31 DIAGNOSIS — M19041 Primary osteoarthritis, right hand: Secondary | ICD-10-CM | POA: Diagnosis not present

## 2020-11-22 DIAGNOSIS — G5623 Lesion of ulnar nerve, bilateral upper limbs: Secondary | ICD-10-CM | POA: Insufficient documentation

## 2020-11-22 DIAGNOSIS — M19041 Primary osteoarthritis, right hand: Secondary | ICD-10-CM | POA: Diagnosis not present

## 2020-11-22 DIAGNOSIS — M19042 Primary osteoarthritis, left hand: Secondary | ICD-10-CM | POA: Diagnosis not present

## 2020-11-22 DIAGNOSIS — S5421XA Injury of radial nerve at forearm level, right arm, initial encounter: Secondary | ICD-10-CM | POA: Insufficient documentation

## 2020-11-22 DIAGNOSIS — G5603 Carpal tunnel syndrome, bilateral upper limbs: Secondary | ICD-10-CM | POA: Diagnosis not present

## 2020-11-22 DIAGNOSIS — Z8673 Personal history of transient ischemic attack (TIA), and cerebral infarction without residual deficits: Secondary | ICD-10-CM | POA: Diagnosis not present

## 2020-11-22 DIAGNOSIS — G5631 Lesion of radial nerve, right upper limb: Secondary | ICD-10-CM | POA: Diagnosis not present

## 2020-11-23 DIAGNOSIS — L570 Actinic keratosis: Secondary | ICD-10-CM | POA: Diagnosis not present

## 2020-11-23 DIAGNOSIS — L820 Inflamed seborrheic keratosis: Secondary | ICD-10-CM | POA: Diagnosis not present

## 2020-11-29 DIAGNOSIS — R29898 Other symptoms and signs involving the musculoskeletal system: Secondary | ICD-10-CM | POA: Diagnosis not present

## 2020-11-29 DIAGNOSIS — M19041 Primary osteoarthritis, right hand: Secondary | ICD-10-CM | POA: Diagnosis not present

## 2020-11-29 DIAGNOSIS — G5603 Carpal tunnel syndrome, bilateral upper limbs: Secondary | ICD-10-CM | POA: Diagnosis not present

## 2020-11-29 DIAGNOSIS — M19042 Primary osteoarthritis, left hand: Secondary | ICD-10-CM | POA: Diagnosis not present

## 2020-12-04 ENCOUNTER — Telehealth: Payer: Self-pay | Admitting: Cardiovascular Disease

## 2020-12-04 NOTE — Telephone Encounter (Signed)
FYI

## 2020-12-04 NOTE — Telephone Encounter (Signed)
   [place] Pre-operative Risk Assessment    Patient Name: Justin Conley  DOB: 1940/12/25  MRN: [id number]   HEARTCARE STAFF: - Please ensure there is not already an duplicate clearance open for this procedure. - Under Visit Info/Reason for Call, type in Other and utilize the format Clearance MM/DD/YY or Clearance TBD. Do not use dashes or single digits. - If request is for dental extraction, please clarify the # of teeth to be extracted.  Request for surgical clearance:  1. What type of surgery is being performed? Left Corpal Tunnel Release   2. When is this surgery scheduled? 12-14-20  3. What type of clearance is required (medical clearance vs. Pharmacy clearance to hold med vs. Both)? both  4. Are there any medications that need to be held prior to surgery and how long?Plavix   5. Practice name and name of physician performing surgery? Dr Emmaline Kluver   6. What is the office phone number? [phone]  7.   What is the office fax number? [phone]  8.   Anesthesia type (None, local, MAC, general) ? Light sedation  Glyn Ade 12/04/2020, 3:00 PM  _________________________________________________________________   (provider comments below)

## 2020-12-04 NOTE — Telephone Encounter (Signed)
   Name: TYESON TANIMOTO  DOB: 12/01/1940  MRN: [id number]  Primary Cardiologist: Quay Burow, MD  Chart reviewed as part of pre-operative protocol coverage. Because of Coby Shrewsberry Tebbetts's past medical history and time since last visit, he will require a follow-up visit in order to better assess preoperative cardiovascular risk.  Surgery is 12/14/20, has not been seen since 2020.  Pre-op covering staff: - Please schedule appointment and call patient to inform them. If patient already had an upcoming appointment within acceptable timeframe, please add "pre-op clearance" to the appointment notes so provider is aware. - Please contact requesting surgeon's office via preferred method (i.e, phone, fax) to inform them of need for appointment prior to surgery.  If applicable, this message will also be routed to pharmacy pool and/or primary cardiologist for input on holding anticoagulant/antiplatelet agent as requested below so that this information is available to the clearing provider at time of patient's appointment.   Tami Lin Kirstan Fentress, PA  12/04/2020, 3:10 PM

## 2020-12-04 NOTE — Telephone Encounter (Signed)
S/w both the pt and his wife in scheduling a pre op ppt. I offered an appt 5/6 with Sande Rives, NP, though pt will not be in town. Unfortunately there are no openings at [facility] or [facility]. Pt's wife asked me if we had a cardiologist in [place]. I answered yes. Pt has been scheduled to see Berniece Salines, DO 12/05/20 @ 3:20. Pt's wife thanked me for all the effort to help find an appt for pt so his surgery is not delayed. I will forward notes to Dr. Harriet Masson as well as Juluis Rainier to requesting office.

## 2020-12-05 ENCOUNTER — Encounter: Payer: Self-pay | Admitting: Cardiology

## 2020-12-05 ENCOUNTER — Other Ambulatory Visit: Payer: Self-pay

## 2020-12-05 ENCOUNTER — Ambulatory Visit: Payer: Medicare HMO | Admitting: Cardiology

## 2020-12-05 VITALS — BP 112/60 | HR 75 | Ht 66.5 in | Wt 170.0 lb

## 2020-12-05 DIAGNOSIS — I2510 Atherosclerotic heart disease of native coronary artery without angina pectoris: Secondary | ICD-10-CM

## 2020-12-05 DIAGNOSIS — Z01810 Encounter for preprocedural cardiovascular examination: Secondary | ICD-10-CM | POA: Diagnosis not present

## 2020-12-05 DIAGNOSIS — I639 Cerebral infarction, unspecified: Secondary | ICD-10-CM | POA: Insufficient documentation

## 2020-12-05 DIAGNOSIS — E782 Mixed hyperlipidemia: Secondary | ICD-10-CM

## 2020-12-05 DIAGNOSIS — I10 Essential (primary) hypertension: Secondary | ICD-10-CM | POA: Diagnosis not present

## 2020-12-05 DIAGNOSIS — Z720 Tobacco use: Secondary | ICD-10-CM | POA: Diagnosis not present

## 2020-12-05 NOTE — Progress Notes (Signed)
Cardiology Office Note:    Date:  12/07/2020   ID:  Justin Conley, DOB 1941/01/04, MRN [id number]  PCP:  Nicoletta Dress, MD  Cardiologist:  Quay Burow, MD  Electrophysiologist:  None   Referring MD: Nicoletta Dress, MD   Chief Complaint  Patient presents with  . Pre-op Exam    Carpal tunnel on left wrist   History of Present Illness:    Justin Conley is a 80 y.o. male with a hx of hypertension, hyperlipidemia, smoker, coronary artery disease in May 2014 had a DES to the mid RCA, in August 2016 he had a Myoview which was reported normal low risk study.  The patient previously followed with Dr. Quay Burow and last saw him and August 2020.  The patient is here to for a preoperative visit.  Denies chest pain, shortness of breath, palpitations, lightheadednes/dizziness, lower extremity edema, orthopnea or PND.  Past Medical History:  Diagnosis Date  . Coronary artery disease   . Hyperlipidemia   . Hypertension   . Stroke ([place])   . Tobacco abuse     History reviewed. No pertinent surgical history.  Current Medications: Current Meds  Medication Sig  . aspirin EC 81 MG tablet Take 81 mg by mouth.  Marland Kitchen atorvastatin (LIPITOR) 80 MG tablet Take 1 tablet by mouth at bedtime.  . citalopram (CELEXA) 20 MG tablet Take 20 mg by mouth daily.  . clopidogrel (PLAVIX) 75 MG tablet Take 1 tablet (75 mg total) by mouth daily.  . cyanocobalamin (,VITAMIN B-12,) 1000 MCG/ML injection Inject 1 mL into the muscle every 30 (thirty) days.  Marland Kitchen donepezil (ARICEPT) 5 MG tablet Take 1 tablet by mouth daily.  Marland Kitchen PROAIR HFA 108 (90 Base) MCG/ACT inhaler Inhale 2 puffs into the lungs at bedtime.  . valsartan (DIOVAN) 320 MG tablet Take 320 mg by mouth daily.     Allergies:   Ace inhibitors   Social History   Socioeconomic History  . Marital status: Married    Spouse name: Not on file  . Number of children: Not on file  . Years of education: Not on file  . Highest education level: Not  on file  Occupational History  . Not on file  Tobacco Use  . Smoking status: Current Every Day Smoker  . Smokeless tobacco: Never Used  Substance and Sexual Activity  . Alcohol use: Not on file  . Drug use: Not on file  . Sexual activity: Not on file  Other Topics Concern  . Not on file  Social History Narrative  . Not on file   Social Determinants of Health   Financial Resource Strain: Not on file  Food Insecurity: Not on file  Transportation Needs: Not on file  Physical Activity: Not on file  Stress: Not on file  Social Connections: Not on file     Family History: The patient's family history is not on file.  ROS:   Review of Systems  Constitution: Negative for decreased appetite, fever and weight gain.  HENT: Negative for congestion, ear discharge, hoarse voice and sore throat.   Eyes: Negative for discharge, redness, vision loss in right eye and visual halos.  Cardiovascular: Negative for chest pain, dyspnea on exertion, leg swelling, orthopnea and palpitations.  Respiratory: Negative for cough, hemoptysis, shortness of breath and snoring.   Endocrine: Negative for heat intolerance and polyphagia.  Hematologic/Lymphatic: Negative for bleeding problem. Does not bruise/bleed easily.  Skin: Negative for flushing, nail changes, rash and suspicious  lesions.  Musculoskeletal: Negative for arthritis, joint pain, muscle cramps, myalgias, neck pain and stiffness.  Gastrointestinal: Negative for abdominal pain, bowel incontinence, diarrhea and excessive appetite.  Genitourinary: Negative for decreased libido, genital sores and incomplete emptying.  Neurological: Negative for brief paralysis, focal weakness, headaches and loss of balance.  Psychiatric/Behavioral: Negative for altered mental status, depression and suicidal ideas.  Allergic/Immunologic: Negative for HIV exposure and persistent infections.    EKGs/Labs/Other Studies Reviewed:    The following studies were  reviewed today:   EKG:  The ekg ordered today demonstrates sinus rhythm, heart rate 75 bpm.  Pharmacologic nuclear stress test in August 2016  The left ventricular ejection fraction is normal (55-65%).  Nuclear stress EF: 60%.  There was no ST segment deviation noted during stress.  The study is normal.  This is a low risk study.   Nl GXT myoview   Recent Labs: No results found for requested labs within last 8760 hours.  Recent Lipid Panel No results found for: CHOL, TRIG, HDL, CHOLHDL, VLDL, LDLCALC, LDLDIRECT  Physical Exam:    VS:  BP 112/60 (BP Location: Right Arm, Patient Position: Sitting, Cuff Size: Normal)   Pulse 75   Ht 5' 6.5" (1.689 m)   Wt 170 lb (77.1 kg)   SpO2 94%   BMI 27.03 kg/m     Wt Readings from Last 3 Encounters:  12/05/20 170 lb (77.1 kg)  03/16/19 181 lb 6.4 oz (82.3 kg)  03/09/18 170 lb (77.1 kg)     GEN: Well nourished, well developed in no acute distress HEENT: Normal NECK: No JVD; No carotid bruits LYMPHATICS: No lymphadenopathy CARDIAC: S1S2 noted,RRR, no murmurs, rubs, gallops RESPIRATORY:  Clear to auscultation without rales, wheezing or rhonchi  ABDOMEN: Soft, non-tender, non-distended, +bowel sounds, no guarding. EXTREMITIES: No edema, No cyanosis, no clubbing MUSCULOSKELETAL:  No deformity  SKIN: Warm and dry NEUROLOGIC:  Alert and oriented x 3, non-focal PSYCHIATRIC:  Normal affect, good insight  ASSESSMENT:    1. Coronary artery disease involving native coronary artery of native heart, unspecified whether angina present   2. Preoperative cardiovascular examination   3. Essential hypertension   4. Mixed hyperlipidemia   5. Tobacco abuse    PLAN:     The patient does not have any unstable cardiac conditions.  Upon evaluation today, he can achieve 4 METs or greater without anginal symptoms.  According to [facility] and AHA guidelines, he requires no further cardiac workup prior to he noncardiac surgery and should be at  acceptable risk.  Our service is available as necessary in the perioperative period.  No angina, continue patient his current medication regimen.  Smoking cessation advised. Continue current lipid-lowering agent, Lipitor 80 mg daily for Blood pressure is acceptable, continue with current antihypertensive regimen which includes valsartan 320 mg daily. The patient is in agreement with the above plan. The patient left the office in stable condition.  The patient will follow up in   Medication Adjustments/Labs and Tests Ordered: Current medicines are reviewed at length with the patient today.  Concerns regarding medicines are outlined above.  Orders Placed This Encounter  Procedures  . EKG 12-Lead   No orders of the defined types were placed in this encounter.   Patient Instructions  Medication Instructions:  Your physician recommends that you continue on your current medications as directed. Please refer to the Current Medication list given to you today.  *If you need a refill on your cardiac medications before your next appointment, please  call your pharmacy*   Lab Work: None If you have labs (blood work) drawn today and your tests are completely normal, you will receive your results only by: Marland Kitchen MyChart Message (if you have MyChart) OR . A paper copy in the mail If you have any lab test that is abnormal or we need to change your treatment, we will call you to review the results.   Testing/Procedures: None   Follow-Up: At [facility], you and your health needs are our priority.  As part of our continuing mission to provide you with exceptional heart care, we have created designated Provider Care Teams.  These Care Teams include your primary Cardiologist (physician) and Advanced Practice Providers (APPs -  Physician Assistants and Nurse Practitioners) who all work together to provide you with the care you need, when you need it.  We recommend signing up for the patient portal  called "MyChart".  Sign up information is provided on this After Visit Summary.  MyChart is used to connect with patients for Virtual Visits (Telemedicine).  Patients are able to view lab/test results, encounter notes, upcoming appointments, etc.  Non-urgent messages can be sent to your provider as well.   To learn more about what you can do with MyChart, go to [email or web address].    Your next appointment:   1 year(s)  The format for your next appointment:   In Person  Provider:   Berniece Salines, DO   Other Instructions      Adopting a Healthy Lifestyle.  Know what a healthy weight is for you (roughly BMI <25) and aim to maintain this   Aim for 7+ servings of fruits and vegetables daily   65-80+ fluid ounces of water or unsweet tea for healthy kidneys   Limit to max 1 drink of alcohol per day; avoid smoking/tobacco   Limit animal fats in diet for cholesterol and heart health - choose grass fed whenever available   Avoid highly processed foods, and foods high in saturated/trans fats   Aim for low stress - take time to unwind and care for your mental health   Aim for 150 min of moderate intensity exercise weekly for heart health, and weights twice weekly for bone health   Aim for 7-9 hours of sleep daily   When it comes to diets, agreement about the perfect plan isnt easy to find, even among the experts. Experts at the [employer] developed an idea known as the Healthy Eating Plate. Just imagine a plate divided into logical, healthy portions.   The emphasis is on diet quality:   Load up on vegetables and fruits - one-half of your plate: Aim for color and variety, and remember that potatoes dont count.   Go for whole grains - one-quarter of your plate: Whole wheat, barley, wheat berries, quinoa, oats, brown rice, and foods made with them. If you want pasta, go with whole wheat pasta.   Protein power - one-quarter of your plate: Fish, chicken, beans,  and nuts are all healthy, versatile protein sources. Limit red meat.   The diet, however, does go beyond the plate, offering a few other suggestions.   Use healthy plant oils, such as olive, canola, soy, corn, sunflower and peanut. Check the labels, and avoid partially hydrogenated oil, which have unhealthy trans fats.   If youre thirsty, drink water. Coffee and tea are good in moderation, but skip sugary drinks and limit milk and dairy products to one or two daily servings.  The type of carbohydrate in the diet is more important than the amount. Some sources of carbohydrates, such as vegetables, fruits, whole grains, and beans-are healthier than others.   Finally, stay active  Signed, Berniece Salines, DO  12/07/2020 6:22 PM    [facility]

## 2020-12-05 NOTE — Telephone Encounter (Signed)
Note sent to Dr. Lorin Mercy and his surgery scheduler notes sent in error to their office.

## 2020-12-05 NOTE — Patient Instructions (Signed)

## 2020-12-05 NOTE — Telephone Encounter (Signed)
Not our patient

## 2020-12-05 NOTE — Telephone Encounter (Signed)
After some review of the pt's chart it seems the operator had put in the wrong surgeon's office.   CORRECT SURGEON INFORMATION: Orthopaedic,Sports Medicine, Spine - [address]  [address] [place], [place] [address]  [phone]  Tristan Schroeder, DO  [address] [place], [place] [address]  [phone] (Work)  [phone] (Fax)

## 2020-12-11 ENCOUNTER — Telehealth: Payer: Self-pay | Admitting: Cardiovascular Disease

## 2020-12-11 NOTE — Telephone Encounter (Signed)
Follow Up:      Justin Conley is calling to check on the status of pt's clearance. Pt is scheduled for surgery on Friday. Please fax asap to [phone].

## 2020-12-11 NOTE — Telephone Encounter (Signed)
Justin Conley is calling back again to she for got to ask if the pt can stop taking Plavix.Please advise

## 2020-12-11 NOTE — Telephone Encounter (Signed)
Yes ok to hold the plavix for 5 days prior to surgery.

## 2020-12-11 NOTE — Telephone Encounter (Signed)
Hi Dr. Harriet Masson,   You recently saw this patient on 12/05/2020 for pre-op evaluation for upcoming carpal tunnel release. He was felt to be OK for surgery but now surgeon's office is wanting to know if Plavix can be held. He has a history of CAD with DES to mid RCA in 2014. I am assuming this is fine given how long it has been since his PCI but just wanted to confirm with you?  Please route response back P CV DIV PREOP.   Thank you! Justin Conley

## 2020-12-11 NOTE — Telephone Encounter (Signed)
    Justin Conley DOB:  1940-12-31  MRN:  [id number]   Primary Cardiologist: Quay Burow, MD  Chart reviewed as part of pre-operative protocol coverage. Patient was seen by Dr. Harriet Masson on 12/05/2020 for pre-op evaluation. Per Dr. Terrial Rhodes note: "The patient does not have any unstable cardiac conditions. Upon evaluation today, he can achieve 4 METs or greater without anginal symptoms. According to [facility] and AHA guidelines, he requires no further cardiac workup prior to he noncardiac surgery and should be at acceptable risk. Our service is available as necessary in the perioperative period."  Per Dr. Harriet Masson, [place] to hold Plavix for 5 days prior to surgery. Please restart this as soon as it is felt to be safe following procedure.   I will route this recommendation to the requesting party via Epic fax function and remove from pre-op pool.  Please call with questions.  [name], PA-C 12/11/2020, 8:50 AM

## 2020-12-11 NOTE — Telephone Encounter (Signed)
    Justin Conley DOB:  05-25-1941  MRN:  [id number]   Primary Cardiologist: Quay Burow, MD  Chart reviewed as part of pre-operative protocol coverage. Patient was seen by Dr. Harriet Masson on 12/05/2020 for pre-op evaluation. Per Dr. Terrial Rhodes note: "The patient does not have any unstable cardiac conditions.  Upon evaluation today, he can achieve 4 METs or greater without anginal symptoms.  According to [facility] and AHA guidelines, he requires no further cardiac workup prior to he noncardiac surgery and should be at acceptable risk.  Our service is available as necessary in the perioperative period."  I will route this recommendation to the requesting party via [facility] fax function and remove from pre-op pool.  Please call with questions.  Justin Mclean, PA-C 12/11/2020, 8:50 AM

## 2020-12-14 DIAGNOSIS — K219 Gastro-esophageal reflux disease without esophagitis: Secondary | ICD-10-CM | POA: Diagnosis not present

## 2020-12-14 DIAGNOSIS — E669 Obesity, unspecified: Secondary | ICD-10-CM | POA: Diagnosis not present

## 2020-12-14 DIAGNOSIS — H547 Unspecified visual loss: Secondary | ICD-10-CM | POA: Diagnosis not present

## 2020-12-14 DIAGNOSIS — Z955 Presence of coronary angioplasty implant and graft: Secondary | ICD-10-CM | POA: Diagnosis not present

## 2020-12-14 DIAGNOSIS — I10 Essential (primary) hypertension: Secondary | ICD-10-CM | POA: Diagnosis not present

## 2020-12-14 DIAGNOSIS — G5603 Carpal tunnel syndrome, bilateral upper limbs: Secondary | ICD-10-CM | POA: Diagnosis not present

## 2020-12-14 DIAGNOSIS — H9190 Unspecified hearing loss, unspecified ear: Secondary | ICD-10-CM | POA: Diagnosis not present

## 2020-12-14 DIAGNOSIS — R0683 Snoring: Secondary | ICD-10-CM | POA: Diagnosis not present

## 2020-12-14 DIAGNOSIS — E785 Hyperlipidemia, unspecified: Secondary | ICD-10-CM | POA: Diagnosis not present

## 2020-12-14 DIAGNOSIS — G5602 Carpal tunnel syndrome, left upper limb: Secondary | ICD-10-CM | POA: Diagnosis not present

## 2020-12-14 DIAGNOSIS — J449 Chronic obstructive pulmonary disease, unspecified: Secondary | ICD-10-CM | POA: Diagnosis not present

## 2021-02-25 DIAGNOSIS — L821 Other seborrheic keratosis: Secondary | ICD-10-CM | POA: Diagnosis not present

## 2021-02-25 DIAGNOSIS — L570 Actinic keratosis: Secondary | ICD-10-CM | POA: Diagnosis not present

## 2021-02-25 DIAGNOSIS — L578 Other skin changes due to chronic exposure to nonionizing radiation: Secondary | ICD-10-CM | POA: Diagnosis not present

## 2021-04-05 DIAGNOSIS — M25551 Pain in right hip: Secondary | ICD-10-CM | POA: Diagnosis not present

## 2021-04-24 DIAGNOSIS — Z23 Encounter for immunization: Secondary | ICD-10-CM | POA: Diagnosis not present

## 2021-04-24 DIAGNOSIS — D519 Vitamin B12 deficiency anemia, unspecified: Secondary | ICD-10-CM | POA: Diagnosis not present

## 2021-04-24 DIAGNOSIS — D509 Iron deficiency anemia, unspecified: Secondary | ICD-10-CM | POA: Diagnosis not present

## 2021-04-24 DIAGNOSIS — I2510 Atherosclerotic heart disease of native coronary artery without angina pectoris: Secondary | ICD-10-CM | POA: Diagnosis not present

## 2021-04-24 DIAGNOSIS — F0150 Vascular dementia without behavioral disturbance: Secondary | ICD-10-CM | POA: Diagnosis not present

## 2021-04-24 DIAGNOSIS — R69 Illness, unspecified: Secondary | ICD-10-CM | POA: Diagnosis not present

## 2021-04-24 DIAGNOSIS — E785 Hyperlipidemia, unspecified: Secondary | ICD-10-CM | POA: Diagnosis not present

## 2021-04-24 DIAGNOSIS — Z9861 Coronary angioplasty status: Secondary | ICD-10-CM | POA: Diagnosis not present

## 2021-04-24 DIAGNOSIS — R339 Retention of urine, unspecified: Secondary | ICD-10-CM | POA: Diagnosis not present

## 2021-04-24 DIAGNOSIS — F17200 Nicotine dependence, unspecified, uncomplicated: Secondary | ICD-10-CM | POA: Diagnosis not present

## 2021-04-24 DIAGNOSIS — N401 Enlarged prostate with lower urinary tract symptoms: Secondary | ICD-10-CM | POA: Diagnosis not present

## 2021-04-30 DIAGNOSIS — E785 Hyperlipidemia, unspecified: Secondary | ICD-10-CM | POA: Diagnosis not present

## 2021-04-30 DIAGNOSIS — J449 Chronic obstructive pulmonary disease, unspecified: Secondary | ICD-10-CM | POA: Diagnosis not present

## 2021-04-30 DIAGNOSIS — I10 Essential (primary) hypertension: Secondary | ICD-10-CM | POA: Diagnosis not present

## 2021-05-01 DIAGNOSIS — M25551 Pain in right hip: Secondary | ICD-10-CM | POA: Diagnosis not present

## 2021-08-02 DIAGNOSIS — J449 Chronic obstructive pulmonary disease, unspecified: Secondary | ICD-10-CM | POA: Diagnosis not present

## 2021-08-02 DIAGNOSIS — E785 Hyperlipidemia, unspecified: Secondary | ICD-10-CM | POA: Diagnosis not present

## 2021-08-02 DIAGNOSIS — I10 Essential (primary) hypertension: Secondary | ICD-10-CM | POA: Diagnosis not present

## 2021-09-10 DIAGNOSIS — S61411A Laceration without foreign body of right hand, initial encounter: Secondary | ICD-10-CM | POA: Diagnosis not present

## 2021-10-22 DIAGNOSIS — Z125 Encounter for screening for malignant neoplasm of prostate: Secondary | ICD-10-CM | POA: Diagnosis not present

## 2021-10-22 DIAGNOSIS — R69 Illness, unspecified: Secondary | ICD-10-CM | POA: Diagnosis not present

## 2021-10-22 DIAGNOSIS — E785 Hyperlipidemia, unspecified: Secondary | ICD-10-CM | POA: Diagnosis not present

## 2021-10-22 DIAGNOSIS — Z9861 Coronary angioplasty status: Secondary | ICD-10-CM | POA: Diagnosis not present

## 2021-10-22 DIAGNOSIS — N401 Enlarged prostate with lower urinary tract symptoms: Secondary | ICD-10-CM | POA: Diagnosis not present

## 2021-10-22 DIAGNOSIS — F0150 Vascular dementia without behavioral disturbance: Secondary | ICD-10-CM | POA: Diagnosis not present

## 2021-10-22 DIAGNOSIS — D509 Iron deficiency anemia, unspecified: Secondary | ICD-10-CM | POA: Diagnosis not present

## 2021-10-22 DIAGNOSIS — F17200 Nicotine dependence, unspecified, uncomplicated: Secondary | ICD-10-CM | POA: Diagnosis not present

## 2021-10-22 DIAGNOSIS — Z6827 Body mass index (BMI) 27.0-27.9, adult: Secondary | ICD-10-CM | POA: Diagnosis not present

## 2021-10-22 DIAGNOSIS — I2510 Atherosclerotic heart disease of native coronary artery without angina pectoris: Secondary | ICD-10-CM | POA: Diagnosis not present

## 2021-10-22 DIAGNOSIS — D519 Vitamin B12 deficiency anemia, unspecified: Secondary | ICD-10-CM | POA: Diagnosis not present

## 2021-11-01 DIAGNOSIS — J449 Chronic obstructive pulmonary disease, unspecified: Secondary | ICD-10-CM | POA: Diagnosis not present

## 2021-11-01 DIAGNOSIS — E785 Hyperlipidemia, unspecified: Secondary | ICD-10-CM | POA: Diagnosis not present

## 2021-11-01 DIAGNOSIS — I10 Essential (primary) hypertension: Secondary | ICD-10-CM | POA: Diagnosis not present

## 2021-12-12 DIAGNOSIS — S29011A Strain of muscle and tendon of front wall of thorax, initial encounter: Secondary | ICD-10-CM | POA: Diagnosis not present

## 2021-12-25 DIAGNOSIS — J209 Acute bronchitis, unspecified: Secondary | ICD-10-CM | POA: Diagnosis not present

## 2021-12-25 DIAGNOSIS — R062 Wheezing: Secondary | ICD-10-CM | POA: Diagnosis not present

## 2021-12-25 DIAGNOSIS — F17200 Nicotine dependence, unspecified, uncomplicated: Secondary | ICD-10-CM | POA: Diagnosis not present

## 2021-12-25 DIAGNOSIS — R059 Cough, unspecified: Secondary | ICD-10-CM | POA: Diagnosis not present

## 2021-12-25 DIAGNOSIS — J440 Chronic obstructive pulmonary disease with acute lower respiratory infection: Secondary | ICD-10-CM | POA: Diagnosis not present

## 2022-01-23 DIAGNOSIS — D638 Anemia in other chronic diseases classified elsewhere: Secondary | ICD-10-CM | POA: Diagnosis not present

## 2022-01-23 DIAGNOSIS — E538 Deficiency of other specified B group vitamins: Secondary | ICD-10-CM | POA: Diagnosis not present

## 2022-01-27 DIAGNOSIS — L570 Actinic keratosis: Secondary | ICD-10-CM | POA: Diagnosis not present

## 2022-01-27 DIAGNOSIS — L821 Other seborrheic keratosis: Secondary | ICD-10-CM | POA: Diagnosis not present

## 2022-01-27 DIAGNOSIS — L578 Other skin changes due to chronic exposure to nonionizing radiation: Secondary | ICD-10-CM | POA: Diagnosis not present

## 2022-04-24 DIAGNOSIS — Z6827 Body mass index (BMI) 27.0-27.9, adult: Secondary | ICD-10-CM | POA: Diagnosis not present

## 2022-04-24 DIAGNOSIS — F0150 Vascular dementia without behavioral disturbance: Secondary | ICD-10-CM | POA: Diagnosis not present

## 2022-04-24 DIAGNOSIS — Z9861 Coronary angioplasty status: Secondary | ICD-10-CM | POA: Diagnosis not present

## 2022-04-24 DIAGNOSIS — D519 Vitamin B12 deficiency anemia, unspecified: Secondary | ICD-10-CM | POA: Diagnosis not present

## 2022-04-24 DIAGNOSIS — E785 Hyperlipidemia, unspecified: Secondary | ICD-10-CM | POA: Diagnosis not present

## 2022-04-24 DIAGNOSIS — N401 Enlarged prostate with lower urinary tract symptoms: Secondary | ICD-10-CM | POA: Diagnosis not present

## 2022-04-24 DIAGNOSIS — F17200 Nicotine dependence, unspecified, uncomplicated: Secondary | ICD-10-CM | POA: Diagnosis not present

## 2022-04-24 DIAGNOSIS — D509 Iron deficiency anemia, unspecified: Secondary | ICD-10-CM | POA: Diagnosis not present

## 2022-04-24 DIAGNOSIS — I2510 Atherosclerotic heart disease of native coronary artery without angina pectoris: Secondary | ICD-10-CM | POA: Diagnosis not present

## 2022-05-13 DIAGNOSIS — R351 Nocturia: Secondary | ICD-10-CM | POA: Diagnosis not present

## 2022-05-13 DIAGNOSIS — R350 Frequency of micturition: Secondary | ICD-10-CM | POA: Diagnosis not present

## 2022-05-27 DIAGNOSIS — L578 Other skin changes due to chronic exposure to nonionizing radiation: Secondary | ICD-10-CM | POA: Diagnosis not present

## 2022-05-27 DIAGNOSIS — L570 Actinic keratosis: Secondary | ICD-10-CM | POA: Diagnosis not present

## 2022-05-27 DIAGNOSIS — L821 Other seborrheic keratosis: Secondary | ICD-10-CM | POA: Diagnosis not present

## 2022-07-07 DIAGNOSIS — M545 Low back pain, unspecified: Secondary | ICD-10-CM | POA: Diagnosis not present

## 2022-07-07 DIAGNOSIS — M47816 Spondylosis without myelopathy or radiculopathy, lumbar region: Secondary | ICD-10-CM | POA: Diagnosis not present

## 2022-07-08 DIAGNOSIS — M545 Low back pain, unspecified: Secondary | ICD-10-CM | POA: Diagnosis not present

## 2022-07-11 DIAGNOSIS — M545 Low back pain, unspecified: Secondary | ICD-10-CM | POA: Diagnosis not present

## 2022-07-14 DIAGNOSIS — M545 Low back pain, unspecified: Secondary | ICD-10-CM | POA: Diagnosis not present

## 2022-10-23 DIAGNOSIS — R69 Illness, unspecified: Secondary | ICD-10-CM | POA: Diagnosis not present

## 2022-10-23 DIAGNOSIS — N401 Enlarged prostate with lower urinary tract symptoms: Secondary | ICD-10-CM | POA: Diagnosis not present

## 2022-10-23 DIAGNOSIS — D519 Vitamin B12 deficiency anemia, unspecified: Secondary | ICD-10-CM | POA: Diagnosis not present

## 2022-10-23 DIAGNOSIS — Z125 Encounter for screening for malignant neoplasm of prostate: Secondary | ICD-10-CM | POA: Diagnosis not present

## 2022-10-23 DIAGNOSIS — Z9861 Coronary angioplasty status: Secondary | ICD-10-CM | POA: Diagnosis not present

## 2022-10-23 DIAGNOSIS — E785 Hyperlipidemia, unspecified: Secondary | ICD-10-CM | POA: Diagnosis not present

## 2022-10-23 DIAGNOSIS — I2510 Atherosclerotic heart disease of native coronary artery without angina pectoris: Secondary | ICD-10-CM | POA: Diagnosis not present

## 2022-10-23 DIAGNOSIS — Z6827 Body mass index (BMI) 27.0-27.9, adult: Secondary | ICD-10-CM | POA: Diagnosis not present

## 2022-10-23 DIAGNOSIS — D509 Iron deficiency anemia, unspecified: Secondary | ICD-10-CM | POA: Diagnosis not present

## 2022-12-02 DIAGNOSIS — S30860A Insect bite (nonvenomous) of lower back and pelvis, initial encounter: Secondary | ICD-10-CM | POA: Diagnosis not present

## 2022-12-02 DIAGNOSIS — R21 Rash and other nonspecific skin eruption: Secondary | ICD-10-CM | POA: Diagnosis not present

## 2022-12-02 DIAGNOSIS — W57XXXA Bitten or stung by nonvenomous insect and other nonvenomous arthropods, initial encounter: Secondary | ICD-10-CM | POA: Diagnosis not present

## 2023-02-04 ENCOUNTER — Ambulatory Visit: Payer: Medicare HMO | Admitting: Podiatry

## 2023-02-04 DIAGNOSIS — S90211A Contusion of right great toe with damage to nail, initial encounter: Secondary | ICD-10-CM | POA: Diagnosis not present

## 2023-02-04 NOTE — Progress Notes (Signed)
       Chief Complaint  Patient presents with   Nail Problem    Right nail brittle and discolored. Denies any pain at this time. Patient is not diabetic.    HPI: 82 y.o. male presents today with concern of a discolored and brittle nail on the right great toe.  He thinks he might of stubbed the toe couple months back.  The nail has been discolored ever since he thinks it might need to come off today.  He denies any pain.  Denies any drainage.  Past Medical History:  Diagnosis Date   Coronary artery disease    Hyperlipidemia    Hypertension    Stroke [facility])    Tobacco abuse     No past surgical history on file.  Allergies  Allergen Reactions   Ace Inhibitors Cough     Physical Exam: Palpable DP and PT pulse bilateral.  No appreciable edema.  No surrounding erythema, ecchymosis, or edema to the periungual aspect of the right great toe nail.  The nail itself is lytic with subungual ecchymosis, but this is stable and dry at this time.  There is brittleness and unevenness to the remaining nail plate.  No signs of infection noted  Assessment/Plan of Care: Contusion right great toe with damage to nail.  Stable.  No signs of infection.   Discussed clinical findings with patient today.  The right hallux nail was able to be debrided carefully and painlessly back to the level of the eponychium.  The exposed nailbed is dry and stable.  The area was sanded smooth to prevent any rough areas from catching on his socks.  No further treatment is needed at this time.  Patient was instructed to keep an eye on the new nail as it begins to grow in.  If it starts to look discolored or abnormal he should come in to be evaluated as there could be a topical medication that he could apply to the area to help improve new nail appearance   Breezie Micucci D. Jesaiah Fabiano, DPM, FACFAS [facility]     [address] [place], [place] [address]                Office  [phone]  Fax [phone]

## 2023-04-21 DIAGNOSIS — L578 Other skin changes due to chronic exposure to nonionizing radiation: Secondary | ICD-10-CM | POA: Diagnosis not present

## 2023-04-21 DIAGNOSIS — L821 Other seborrheic keratosis: Secondary | ICD-10-CM | POA: Diagnosis not present

## 2023-04-21 DIAGNOSIS — L570 Actinic keratosis: Secondary | ICD-10-CM | POA: Diagnosis not present

## 2023-04-28 DIAGNOSIS — I2510 Atherosclerotic heart disease of native coronary artery without angina pectoris: Secondary | ICD-10-CM | POA: Diagnosis not present

## 2023-04-28 DIAGNOSIS — E785 Hyperlipidemia, unspecified: Secondary | ICD-10-CM | POA: Diagnosis not present

## 2023-04-28 DIAGNOSIS — Z9861 Coronary angioplasty status: Secondary | ICD-10-CM | POA: Diagnosis not present

## 2023-04-28 DIAGNOSIS — D509 Iron deficiency anemia, unspecified: Secondary | ICD-10-CM | POA: Diagnosis not present

## 2023-04-28 DIAGNOSIS — F0150 Vascular dementia without behavioral disturbance: Secondary | ICD-10-CM | POA: Diagnosis not present

## 2023-04-28 DIAGNOSIS — D519 Vitamin B12 deficiency anemia, unspecified: Secondary | ICD-10-CM | POA: Diagnosis not present

## 2023-04-28 DIAGNOSIS — Z6827 Body mass index (BMI) 27.0-27.9, adult: Secondary | ICD-10-CM | POA: Diagnosis not present

## 2023-04-28 DIAGNOSIS — N401 Enlarged prostate with lower urinary tract symptoms: Secondary | ICD-10-CM | POA: Diagnosis not present

## 2023-07-07 DIAGNOSIS — G309 Alzheimer's disease, unspecified: Secondary | ICD-10-CM | POA: Diagnosis not present

## 2023-07-07 DIAGNOSIS — G2581 Restless legs syndrome: Secondary | ICD-10-CM | POA: Diagnosis not present

## 2023-07-07 DIAGNOSIS — I739 Peripheral vascular disease, unspecified: Secondary | ICD-10-CM | POA: Diagnosis not present

## 2023-07-07 DIAGNOSIS — N529 Male erectile dysfunction, unspecified: Secondary | ICD-10-CM | POA: Diagnosis not present

## 2023-07-07 DIAGNOSIS — I252 Old myocardial infarction: Secondary | ICD-10-CM | POA: Diagnosis not present

## 2023-07-07 DIAGNOSIS — I509 Heart failure, unspecified: Secondary | ICD-10-CM | POA: Diagnosis not present

## 2023-07-07 DIAGNOSIS — N1831 Chronic kidney disease, stage 3a: Secondary | ICD-10-CM | POA: Diagnosis not present

## 2023-07-07 DIAGNOSIS — M1990 Unspecified osteoarthritis, unspecified site: Secondary | ICD-10-CM | POA: Diagnosis not present

## 2023-07-07 DIAGNOSIS — J439 Emphysema, unspecified: Secondary | ICD-10-CM | POA: Diagnosis not present

## 2023-07-07 DIAGNOSIS — N400 Enlarged prostate without lower urinary tract symptoms: Secondary | ICD-10-CM | POA: Diagnosis not present

## 2023-07-07 DIAGNOSIS — I25119 Atherosclerotic heart disease of native coronary artery with unspecified angina pectoris: Secondary | ICD-10-CM | POA: Diagnosis not present

## 2023-07-07 DIAGNOSIS — E785 Hyperlipidemia, unspecified: Secondary | ICD-10-CM | POA: Diagnosis not present

## 2023-07-23 DIAGNOSIS — K649 Unspecified hemorrhoids: Secondary | ICD-10-CM | POA: Diagnosis not present

## 2023-07-23 DIAGNOSIS — K5790 Diverticulosis of intestine, part unspecified, without perforation or abscess without bleeding: Secondary | ICD-10-CM | POA: Diagnosis not present

## 2023-07-23 DIAGNOSIS — K219 Gastro-esophageal reflux disease without esophagitis: Secondary | ICD-10-CM | POA: Diagnosis not present

## 2023-07-23 DIAGNOSIS — R195 Other fecal abnormalities: Secondary | ICD-10-CM | POA: Diagnosis not present

## 2023-09-28 DIAGNOSIS — R195 Other fecal abnormalities: Secondary | ICD-10-CM | POA: Diagnosis not present

## 2023-09-28 DIAGNOSIS — K5790 Diverticulosis of intestine, part unspecified, without perforation or abscess without bleeding: Secondary | ICD-10-CM | POA: Diagnosis not present

## 2023-09-28 DIAGNOSIS — K219 Gastro-esophageal reflux disease without esophagitis: Secondary | ICD-10-CM | POA: Diagnosis not present

## 2023-09-28 DIAGNOSIS — K649 Unspecified hemorrhoids: Secondary | ICD-10-CM | POA: Diagnosis not present

## 2023-10-30 DIAGNOSIS — I2510 Atherosclerotic heart disease of native coronary artery without angina pectoris: Secondary | ICD-10-CM | POA: Diagnosis not present

## 2023-10-30 DIAGNOSIS — D509 Iron deficiency anemia, unspecified: Secondary | ICD-10-CM | POA: Diagnosis not present

## 2023-10-30 DIAGNOSIS — D519 Vitamin B12 deficiency anemia, unspecified: Secondary | ICD-10-CM | POA: Diagnosis not present

## 2023-10-30 DIAGNOSIS — Z6827 Body mass index (BMI) 27.0-27.9, adult: Secondary | ICD-10-CM | POA: Diagnosis not present

## 2023-10-30 DIAGNOSIS — N401 Enlarged prostate with lower urinary tract symptoms: Secondary | ICD-10-CM | POA: Diagnosis not present

## 2023-10-30 DIAGNOSIS — E785 Hyperlipidemia, unspecified: Secondary | ICD-10-CM | POA: Diagnosis not present

## 2023-10-30 DIAGNOSIS — F17200 Nicotine dependence, unspecified, uncomplicated: Secondary | ICD-10-CM | POA: Diagnosis not present

## 2023-10-30 DIAGNOSIS — R7301 Impaired fasting glucose: Secondary | ICD-10-CM | POA: Diagnosis not present

## 2023-10-30 DIAGNOSIS — F0150 Vascular dementia without behavioral disturbance: Secondary | ICD-10-CM | POA: Diagnosis not present

## 2023-10-30 DIAGNOSIS — Z9861 Coronary angioplasty status: Secondary | ICD-10-CM | POA: Diagnosis not present

## 2023-11-09 DIAGNOSIS — E119 Type 2 diabetes mellitus without complications: Secondary | ICD-10-CM | POA: Diagnosis not present

## 2023-11-26 ENCOUNTER — Ambulatory Visit: Admitting: Podiatry

## 2023-11-26 ENCOUNTER — Ambulatory Visit (INDEPENDENT_AMBULATORY_CARE_PROVIDER_SITE_OTHER)

## 2023-11-26 DIAGNOSIS — M722 Plantar fascial fibromatosis: Secondary | ICD-10-CM

## 2023-11-26 DIAGNOSIS — M79671 Pain in right foot: Secondary | ICD-10-CM | POA: Diagnosis not present

## 2023-11-26 NOTE — Progress Notes (Signed)
     Chief Complaint  Patient presents with   Foot Pain    Here today for right heel pain, that started a month ago. Pain is pretty consistant but is worse going from rest to active. Not diabetic and takes bot ASA ans Plavix    HPI: 83 y.o. male presenting today with c/o pain in the bottom of the right heel.  Pain is rated as 8/10.  Symptoms have been present for about 1 month.  He wears Hey Dude shoes with no support.  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  He takes aspirin/Plavix  combination daily  Past Medical History:  Diagnosis Date   Coronary artery disease    Hyperlipidemia    Hypertension    Stroke [facility])    Tobacco abuse     No past surgical history on file.  Allergies  Allergen Reactions   Ace Inhibitors Cough     Physical Exam: General: The patient is alert and oriented x3 in no acute distress.  Dermatology:  No ecchymosis, erythema, or edema bilateral.  No open lesions.    Vascular: Palpable pedal pulses bilaterally. Capillary refill within normal limits.  No appreciable edema.    Neurological: Light touch sensation intact bilateral.  MMT 5/5 to lower extremity bilateral. Negative Tinel's sign with percussion of the posterior tibial nerve on the affected extremity.    Musculoskeletal Exam:  There is pain on palpation of the plantarmedial & plantarcentral aspect of right heel.  No gaps or nodules within the plantar fascia.  Positive Windlass mechanism bilateral.  Antalgic gait noted with first few steps upon standing.  No pain on palpation of achilles tendon bilateral.  Ankle df less than 10 degrees with knee extended b/l.  Radiographic Exam (right foot, 3 weightbearing views, 11/26/2023):  Normal osseous mineralization. Joint spaces preserved.  Tiny inferior calcaneal spur noted.  No fracture seen  Assessment/Plan of Care: 1. Plantar fasciitis of right foot   2. Pain of right heel     -Reviewed etiology of plantar fasciitis with patient.  Discussed treatment options with  patient today, including cortisone injection, NSAID course of treatment, stretching exercises, physical therapy, use of night splint, rest, icing the heel, arch supports/orthotics, and supportive shoe gear.    With the patient's verbal consent, a corticosteroid injection was administered to the right heel, consisting of a mixture of 1% lidocaine  plain, 0.5% Sensorcaine plain, and Kenalog -10 for a total of 1.5cc administered.  A Band-aid was applied. Pain level post-injection is 5/10.  Patient was shown the power step inserts and will see if these are of decent fit within his hey dude shoes.  Patient did not seem to be excited about the idea of some type of "prosthetic device" in his shoes.    Stretching exercises printed and dispensed for the patient to start performing daily  Return in about 4 weeks (around 12/24/2023) for f/u plantar fasciitis.   Joe Murders, DPM, FACFAS [facility]     [address] [place], [place] [address]                Office [phone]  Fax [phone]

## 2023-11-26 NOTE — Patient Instructions (Signed)

## 2023-12-08 DIAGNOSIS — J440 Chronic obstructive pulmonary disease with acute lower respiratory infection: Secondary | ICD-10-CM | POA: Diagnosis not present

## 2023-12-08 DIAGNOSIS — J209 Acute bronchitis, unspecified: Secondary | ICD-10-CM | POA: Diagnosis not present

## 2023-12-23 ENCOUNTER — Ambulatory Visit: Admitting: Podiatry

## 2023-12-24 DIAGNOSIS — D519 Vitamin B12 deficiency anemia, unspecified: Secondary | ICD-10-CM | POA: Diagnosis not present

## 2023-12-24 DIAGNOSIS — F0150 Vascular dementia without behavioral disturbance: Secondary | ICD-10-CM | POA: Diagnosis not present

## 2023-12-24 DIAGNOSIS — Z8673 Personal history of transient ischemic attack (TIA), and cerebral infarction without residual deficits: Secondary | ICD-10-CM | POA: Diagnosis not present

## 2024-01-05 ENCOUNTER — Other Ambulatory Visit: Payer: Self-pay | Admitting: Internal Medicine

## 2024-01-05 DIAGNOSIS — F0150 Vascular dementia without behavioral disturbance: Secondary | ICD-10-CM

## 2024-01-08 ENCOUNTER — Ambulatory Visit
Admission: RE | Admit: 2024-01-08 | Discharge: 2024-01-08 | Disposition: A | Discharge status: Home / Self Care | Source: Ambulatory Visit | Attending: Internal Medicine | Admitting: Internal Medicine

## 2024-01-08 DIAGNOSIS — F0150 Vascular dementia without behavioral disturbance: Secondary | ICD-10-CM | POA: Diagnosis not present

## 2024-02-01 DIAGNOSIS — F0150 Vascular dementia without behavioral disturbance: Secondary | ICD-10-CM | POA: Diagnosis not present

## 2024-02-01 DIAGNOSIS — Z125 Encounter for screening for malignant neoplasm of prostate: Secondary | ICD-10-CM | POA: Diagnosis not present

## 2024-02-01 DIAGNOSIS — E782 Mixed hyperlipidemia: Secondary | ICD-10-CM | POA: Diagnosis not present

## 2024-02-01 DIAGNOSIS — D509 Iron deficiency anemia, unspecified: Secondary | ICD-10-CM | POA: Diagnosis not present

## 2024-02-01 DIAGNOSIS — G4762 Sleep related leg cramps: Secondary | ICD-10-CM | POA: Diagnosis not present

## 2024-02-01 DIAGNOSIS — E785 Hyperlipidemia, unspecified: Secondary | ICD-10-CM | POA: Diagnosis not present

## 2024-02-01 DIAGNOSIS — D519 Vitamin B12 deficiency anemia, unspecified: Secondary | ICD-10-CM | POA: Diagnosis not present

## 2024-02-01 DIAGNOSIS — E1169 Type 2 diabetes mellitus with other specified complication: Secondary | ICD-10-CM | POA: Diagnosis not present

## 2024-03-06 DIAGNOSIS — S41111A Laceration without foreign body of right upper arm, initial encounter: Secondary | ICD-10-CM | POA: Diagnosis not present

## 2024-03-06 DIAGNOSIS — S61412A Laceration without foreign body of left hand, initial encounter: Secondary | ICD-10-CM | POA: Diagnosis not present

## 2024-03-06 DIAGNOSIS — W19XXXA Unspecified fall, initial encounter: Secondary | ICD-10-CM | POA: Diagnosis not present

## 2024-03-06 DIAGNOSIS — S81011A Laceration without foreign body, right knee, initial encounter: Secondary | ICD-10-CM | POA: Diagnosis not present

## 2024-03-06 DIAGNOSIS — S61411A Laceration without foreign body of right hand, initial encounter: Secondary | ICD-10-CM | POA: Diagnosis not present

## 2024-03-06 DIAGNOSIS — S81012A Laceration without foreign body, left knee, initial encounter: Secondary | ICD-10-CM | POA: Diagnosis not present

## 2024-03-06 DIAGNOSIS — S41112A Laceration without foreign body of left upper arm, initial encounter: Secondary | ICD-10-CM | POA: Diagnosis not present

## 2024-03-07 DIAGNOSIS — S51812A Laceration without foreign body of left forearm, initial encounter: Secondary | ICD-10-CM | POA: Diagnosis not present

## 2024-03-17 DIAGNOSIS — S41101A Unspecified open wound of right upper arm, initial encounter: Secondary | ICD-10-CM | POA: Diagnosis not present

## 2024-03-28 DIAGNOSIS — Z6828 Body mass index (BMI) 28.0-28.9, adult: Secondary | ICD-10-CM | POA: Diagnosis not present

## 2024-03-28 DIAGNOSIS — S51801A Unspecified open wound of right forearm, initial encounter: Secondary | ICD-10-CM | POA: Diagnosis not present

## 2024-03-31 DIAGNOSIS — S51801A Unspecified open wound of right forearm, initial encounter: Secondary | ICD-10-CM | POA: Diagnosis not present

## 2024-04-07 DIAGNOSIS — J209 Acute bronchitis, unspecified: Secondary | ICD-10-CM | POA: Diagnosis not present

## 2024-04-07 DIAGNOSIS — J440 Chronic obstructive pulmonary disease with acute lower respiratory infection: Secondary | ICD-10-CM | POA: Diagnosis not present

## 2024-05-03 DIAGNOSIS — M19012 Primary osteoarthritis, left shoulder: Secondary | ICD-10-CM | POA: Diagnosis not present

## 2024-05-18 DIAGNOSIS — E119 Type 2 diabetes mellitus without complications: Secondary | ICD-10-CM | POA: Diagnosis not present

## 2024-06-21 DIAGNOSIS — L821 Other seborrheic keratosis: Secondary | ICD-10-CM | POA: Diagnosis not present

## 2024-06-21 DIAGNOSIS — C44321 Squamous cell carcinoma of skin of nose: Secondary | ICD-10-CM | POA: Diagnosis not present

## 2024-06-21 DIAGNOSIS — L820 Inflamed seborrheic keratosis: Secondary | ICD-10-CM | POA: Diagnosis not present

## 2024-06-21 DIAGNOSIS — L570 Actinic keratosis: Secondary | ICD-10-CM | POA: Diagnosis not present

## 2024-06-21 DIAGNOSIS — L578 Other skin changes due to chronic exposure to nonionizing radiation: Secondary | ICD-10-CM | POA: Diagnosis not present

## 2024-07-12 DIAGNOSIS — C44321 Squamous cell carcinoma of skin of nose: Secondary | ICD-10-CM | POA: Diagnosis not present
